# Patient Record
Sex: Female | Born: 1980 | Race: Black or African American | Hispanic: No | Marital: Single | Smoking: Current every day smoker
Health system: Southern US, Community
[De-identification: ages and names within clinical notes are randomized; demographics above are authoritative.]

## PROBLEM LIST (undated history)

## (undated) DIAGNOSIS — D649 Anemia, unspecified: Secondary | ICD-10-CM

## (undated) DIAGNOSIS — K5900 Constipation, unspecified: Secondary | ICD-10-CM

## (undated) DIAGNOSIS — F1290 Cannabis use, unspecified, uncomplicated: Secondary | ICD-10-CM

## (undated) DIAGNOSIS — R0981 Nasal congestion: Secondary | ICD-10-CM

## (undated) DIAGNOSIS — K642 Third degree hemorrhoids: Secondary | ICD-10-CM

## (undated) DIAGNOSIS — Z720 Tobacco use: Secondary | ICD-10-CM

## (undated) DIAGNOSIS — K602 Anal fissure, unspecified: Secondary | ICD-10-CM

## (undated) DIAGNOSIS — J40 Bronchitis, not specified as acute or chronic: Secondary | ICD-10-CM

## (undated) DIAGNOSIS — Z789 Other specified health status: Secondary | ICD-10-CM

## (undated) HISTORY — PX: ANKLE SURGERY: SHX546

---

## 2004-08-22 ENCOUNTER — Emergency Department: Payer: Self-pay | Admitting: General Practice

## 2005-03-04 ENCOUNTER — Emergency Department: Payer: Self-pay | Admitting: Internal Medicine

## 2006-02-06 ENCOUNTER — Emergency Department: Payer: Self-pay | Admitting: Emergency Medicine

## 2006-02-14 ENCOUNTER — Ambulatory Visit: Payer: Self-pay | Admitting: Orthopedic Surgery

## 2006-04-05 ENCOUNTER — Encounter: Payer: Self-pay | Admitting: Orthopedic Surgery

## 2006-04-08 ENCOUNTER — Encounter: Payer: Self-pay | Admitting: Orthopedic Surgery

## 2006-05-09 ENCOUNTER — Encounter: Payer: Self-pay | Admitting: Orthopedic Surgery

## 2006-05-25 ENCOUNTER — Ambulatory Visit: Payer: Self-pay | Admitting: Orthopedic Surgery

## 2006-07-01 ENCOUNTER — Emergency Department: Payer: Self-pay | Admitting: Emergency Medicine

## 2009-02-16 ENCOUNTER — Emergency Department: Payer: Self-pay | Admitting: Emergency Medicine

## 2009-02-18 ENCOUNTER — Inpatient Hospital Stay: Payer: Self-pay | Admitting: Obstetrics and Gynecology

## 2010-07-05 ENCOUNTER — Emergency Department: Payer: Self-pay | Admitting: Emergency Medicine

## 2010-07-06 ENCOUNTER — Emergency Department: Payer: Self-pay | Admitting: Emergency Medicine

## 2016-05-19 ENCOUNTER — Emergency Department: Payer: Self-pay

## 2016-05-19 ENCOUNTER — Encounter: Payer: Self-pay | Admitting: Emergency Medicine

## 2016-05-19 ENCOUNTER — Emergency Department
Admission: EM | Admit: 2016-05-19 | Discharge: 2016-05-19 | Disposition: A | Discharge status: Home / Self Care | Payer: Self-pay | Attending: Student | Admitting: Student

## 2016-05-19 DIAGNOSIS — M25572 Pain in left ankle and joints of left foot: Secondary | ICD-10-CM | POA: Insufficient documentation

## 2016-05-19 DIAGNOSIS — N390 Urinary tract infection, site not specified: Secondary | ICD-10-CM | POA: Insufficient documentation

## 2016-05-19 DIAGNOSIS — R300 Dysuria: Secondary | ICD-10-CM

## 2016-05-19 DIAGNOSIS — F17200 Nicotine dependence, unspecified, uncomplicated: Secondary | ICD-10-CM | POA: Insufficient documentation

## 2016-05-19 DIAGNOSIS — L02412 Cutaneous abscess of left axilla: Secondary | ICD-10-CM | POA: Insufficient documentation

## 2016-05-19 DIAGNOSIS — G8929 Other chronic pain: Secondary | ICD-10-CM | POA: Insufficient documentation

## 2016-05-19 HISTORY — DX: Other specified health status: Z78.9

## 2016-05-19 LAB — URINALYSIS COMPLETE WITH MICROSCOPIC (ARMC ONLY)
Bilirubin Urine: NEGATIVE
Glucose, UA: NEGATIVE mg/dL
HGB URINE DIPSTICK: NEGATIVE
Ketones, ur: NEGATIVE mg/dL
Nitrite: NEGATIVE
PROTEIN: 30 mg/dL — AB
SPECIFIC GRAVITY, URINE: 1.02 (ref 1.005–1.030)
pH: 8 (ref 5.0–8.0)

## 2016-05-19 LAB — COMPREHENSIVE METABOLIC PANEL
ALBUMIN: 3.9 g/dL (ref 3.5–5.0)
ALT: 18 U/L (ref 14–54)
AST: 19 U/L (ref 15–41)
Alkaline Phosphatase: 48 U/L (ref 38–126)
Anion gap: 4 — ABNORMAL LOW (ref 5–15)
BUN: 9 mg/dL (ref 6–20)
CHLORIDE: 105 mmol/L (ref 101–111)
CO2: 26 mmol/L (ref 22–32)
Calcium: 9.2 mg/dL (ref 8.9–10.3)
Creatinine, Ser: 0.83 mg/dL (ref 0.44–1.00)
GFR calc Af Amer: >60 mL/min (ref >60)
Glucose, Bld: 104 mg/dL — ABNORMAL HIGH (ref 65–99)
POTASSIUM: 3.7 mmol/L (ref 3.5–5.1)
SODIUM: 135 mmol/L (ref 135–145)
Total Bilirubin: 0.3 mg/dL (ref 0.3–1.2)
Total Protein: 7.1 g/dL (ref 6.5–8.1)

## 2016-05-19 LAB — GLUCOSE, CAPILLARY: GLUCOSE-CAPILLARY: 119 mg/dL — AB (ref 65–99)

## 2016-05-19 LAB — POCT PREGNANCY, URINE: PREG TEST UR: NEGATIVE

## 2016-05-19 MED ORDER — LIDOCAINE-EPINEPHRINE (PF) 1 %-1:200000 IJ SOLN
INTRAMUSCULAR | Status: AC
  Administered 2016-05-19: 30 mL
  Filled 2016-05-19: qty 30

## 2016-05-19 MED ORDER — CEPHALEXIN 500 MG PO CAPS: 500.0000 mg | ORAL_CAPSULE | Freq: Two times a day (BID) | ORAL | 0 refills | Status: AC

## 2016-05-19 NOTE — ED Notes (Signed)
Pt alert and oriented X4, active, cooperative, pt in NAD. RR even and unlabored, color WNL.  Pt informed to return if any life threatening symptoms occur.   

## 2016-05-19 NOTE — ED Triage Notes (Signed)
Pt reports a cyst to left arm pit that started about 2 days ago. Also has had left ankle and foot pain for 3 months. Denies injury to foot. Reports swelling on the bottom of the foot and burning.

## 2016-05-19 NOTE — ED Provider Notes (Signed)
[facility] Emergency Department Provider Note   ____________________________________________  Time seen: 6:24 pm  I have reviewed the triage vital signs and the nursing notes.   HISTORY  Chief Complaint Foot Pain and Other ("cyst" to left axilla)    HPI Teresa Mclean is a 35 y.o. female with no chronic medical problems who presents for evaluation of 3 days of swelling in the left axilla, gradual onset, constant, moderate to severe, worse when she moves the left arm, worse with palpation. No fevers or chills, no vomiting or diarrhea. She is also complaining of "3 to 6 months" of intermittent left ankle pain in the absence of trauma. She also notes that she intermittently has some dysuria and some intermittent lower abdominal pain and is worried "about my kidneys". She denies chest pain or difficulty breathing.   Past Medical History:  Diagnosis Date  . Patient denies medical problems     There are no active problems to display for this patient.   Past Surgical History:  Procedure Laterality Date  . ANKLE SURGERY      Prior to Admission medications   Not on File    Allergies Review of patient's allergies indicates no known allergies.  History reviewed. No pertinent family history.  Social History Social History  Substance Use Topics  . Smoking status: Current Every Day Smoker  . Smokeless tobacco: Never Used  . Alcohol use Yes    Review of Systems Constitutional: No fever/chills Eyes: No visual changes. ENT: No sore throat. Cardiovascular: Denies chest pain. Respiratory: Denies shortness of breath. Gastrointestinal:+intermittent abdominal pain.  No nausea, no vomiting.  No diarrhea.  No constipation. Genitourinary: Positive for dysuria. Musculoskeletal: Negative for back pain. Skin: Negative for rash. Neurological: Negative for headaches, focal weakness or numbness.  10-point ROS otherwise  negative.  ____________________________________________   PHYSICAL EXAM:  Vitals:   05/19/16 1812 05/19/16 1954  BP:  102/81  Pulse: 100 84  Resp: 18 18  Temp: 98.3 F (36.8 C)   TempSrc: Oral   SpO2: 100% 100%  Weight: 170 lb (77.1 kg)   Height: 5\' 5"  (1.651 m)     VITAL SIGNS: ED Triage Vitals  Enc Vitals Group     BP --      Pulse Rate 05/19/16 1812 100     Resp 05/19/16 1812 18     Temp 05/19/16 1812 98.3 F (36.8 C)     Temp Source 05/19/16 1812 Oral     SpO2 05/19/16 1812 100 %     Weight 05/19/16 1812 170 lb (77.1 kg)     Height 05/19/16 1812 5\' 5"  (1.651 m)     Head Circumference --      Peak Flow --      Pain Score 05/19/16 1813 7     Pain Loc --      Pain Edu? --      Excl. in [place]? --     Constitutional: Alert and oriented. Well appearing and in no acute distress. Eyes: Conjunctivae are normal. PERRL. EOMI. Head: Atraumatic. Nose: No congestion/rhinnorhea. Mouth/Throat: Mucous membranes are moist.  Oropharynx non-erythematous. Neck: No stridor.   Cardiovascular: Normal rate, regular rhythm. Grossly normal heart sounds.  Good peripheral circulation. Respiratory: Normal respiratory effort.  No retractions. Lungs CTAB. Gastrointestinal: Soft and nontender. No distention.  No CVA tenderness. Genitourinary: deferred Musculoskeletal: Mild tenderness inferior to the left lateral knee malleolus of the ankle, no swelling, no erythema, no bony abnormality. 2+ left DP  pulse, wiggles the toes. Neurologic:  Normal speech and language. No gross focal neurologic deficits are appreciated. No gait instability. Skin:  Skin is warm, dry and intact. 2 x 4 cm area of tender induration with fluctuance in the left axilla without associated erythema or warmth. Psychiatric: Mood and affect are normal. Speech and behavior are normal.  ____________________________________________   LABS (all labs ordered are listed, but only abnormal results are displayed)  Labs Reviewed   GLUCOSE, CAPILLARY - Abnormal; Notable for the following:       Result Value   Glucose-Capillary 119 (*)    All other components within normal limits  COMPREHENSIVE METABOLIC PANEL - Abnormal; Notable for the following:    Glucose, Bld 104 (*)    Anion gap 4 (*)    All other components within normal limits  URINALYSIS COMPLETEWITH MICROSCOPIC ([facility] ONLY) - Abnormal; Notable for the following:    Color, Urine YELLOW (*)    APPearance CLEAR (*)    Protein, ur 30 (*)    Leukocytes, UA TRACE (*)    Bacteria, UA RARE (*)    Squamous Epithelial / LPF 0-5 (*)    All other components within normal limits  URINE CULTURE  POC URINE PREG, ED  POCT PREGNANCY, URINE   ____________________________________________  EKG  none ____________________________________________  RADIOLOGY  Xray left ankle FINDINGS: Osseous mineralization normal.  Joint spaces preserved.  No fracture, dislocation, or bone destruction.  IMPRESSION: Normal exam. ____________________________________________   PROCEDURES  Procedure(s) performed:   INCISION AND DRAINAGE Performed by: Loura Pardon A Consent: Verbal consent obtained. Risks and benefits: risks, benefits and alternatives were discussed Type: abscess  Body area: left axilla  Anesthesia: local infiltration  Incision was made with a scalpel.  Local anesthetic: lidocaine 1% with epinephrine  Anesthetic total: 6 ml  Complexity: complex Blunt dissection to break up loculations  Drainage: purulent  Drainage amount: 5 cc  Packing material: 1/4 in iodoform gauze  Patient tolerance: Patient tolerated the procedure well with no immediate complications.    Procedures  Critical Care performed: No  ____________________________________________   INITIAL IMPRESSION / ASSESSMENT AND PLAN / ED COURSE  Pertinent labs & imaging results that were available during my care of the patient were reviewed by me and considered in my medical  decision making (see chart for details).  Teresa Mclean is a 35 y.o. female with no chronic medical problems who presents for evaluation of 3 days of swelling in the left axilla consistent with acute cutaneous abscess without erythema. She has been consented for incision and drainage which I will perform. She also has chronic pain in the left ankle which is nonspecific, we'll obtain plain films to doubt fracture or dislocation. We'll obtain a screening CMP as well as urinalysis and urine pregnancy test given her complaint of dysuria as well as intermittent abdominal cramping though she has a benign abdominal exam and no pain currently and I doubt any acute life-threatening intra-abdominal process versus obstruction, perforation, acute appendicitis or cholecystitis. Her vital signs are stable, she is afebrile and very well-appearing.  ----------------------------------------- 7:55 PM on 05/19/2016 ----------------------------------------- Labs reviewed. Unremarkable CMP, negative pregnancy test, urinalysis with trace leukocytes, 6-30 white blood cells, we'll treat with Keflex for possible UTI and send culture. Plain films of the ankle are negative, I discussed with her that if her chronic ankle pain is ongoing, she will need to follow up with orthopedic surgery. She tolerated incision and drainage of the left axillary abscess very well.  She will return in 48 hours for recheck/packing removal. We discussed meticulous return precautions, need for close follow-up and she is comfortable with the discharge plan. DC home.   Clinical Course     ____________________________________________   FINAL CLINICAL IMPRESSION(S) / ED DIAGNOSES  Final diagnoses:  Cutaneous abscess of left axilla  Ankle pain, chronic, left  Dysuria  UTI (lower urinary tract infection)      NEW MEDICATIONS STARTED DURING THIS VISIT:  New Prescriptions   No medications on file     Note:  This document was prepared  using Dragon voice recognition software and may include unintentional dictation errors.    Joanne Gavel, MD 05/19/16 [phone]

## 2016-05-19 NOTE — ED Triage Notes (Signed)
Pt also c/o intermittent back and lower abdominal pain. Would like her kidneys checked when here as well.

## 2016-05-21 ENCOUNTER — Encounter: Payer: Self-pay | Admitting: *Deleted

## 2016-05-21 ENCOUNTER — Emergency Department
Admission: EM | Admit: 2016-05-21 | Discharge: 2016-05-21 | Disposition: A | Discharge status: Home / Self Care | Payer: Self-pay | Attending: Student | Admitting: Student

## 2016-05-21 DIAGNOSIS — F1290 Cannabis use, unspecified, uncomplicated: Secondary | ICD-10-CM | POA: Insufficient documentation

## 2016-05-21 DIAGNOSIS — Z4801 Encounter for change or removal of surgical wound dressing: Secondary | ICD-10-CM | POA: Insufficient documentation

## 2016-05-21 DIAGNOSIS — Z09 Encounter for follow-up examination after completed treatment for conditions other than malignant neoplasm: Secondary | ICD-10-CM

## 2016-05-21 NOTE — ED Triage Notes (Signed)
Pt is here today for a follow up wound recheck from abscess under left arm

## 2016-05-21 NOTE — Discharge Instructions (Signed)
Finish all antibiotics as prescribed to completion.  Keep wound clean and dry until fully closed then may cleanse per usual routine.

## 2016-05-21 NOTE — ED Provider Notes (Signed)
[facility] Emergency Department Provider Note  ____________________________________________  Time seen: Approximately 6:26 PM  I have reviewed the triage vital signs and the nursing notes.   HISTORY  Chief Complaint Wound Check    HPI Teresa Mclean is a 35 y.o. female , NAD, presents to the emergency department for recheck of abscess following incision and drainage. Patient states she has kept the dressing on since being seen in this emergency department2 days ago for incision and drainage of left axillary abscess. States she has been tolerating her antibiotics well without any side effects or adverse events. Has not noted any bruising or weeping about the incised area. Denies any fevers, chills, body aches, chest pain, shortness of breath, abdominal pain, nausea, vomiting. Has not experienced any pain in the left upper extremity and is without any numbness, weakness, tingling.   Past Medical History:  Diagnosis Date  . Patient denies medical problems     There are no active problems to display for this patient.   Past Surgical History:  Procedure Laterality Date  . ANKLE SURGERY      Prior to Admission medications   Medication Sig Start Date End Date Taking? Authorizing Provider  cephALEXin (KEFLEX) 500 MG capsule Take 1 capsule (500 mg total) by mouth 2 (two) times daily. 05/19/16 05/26/16  Joanne Gavel, MD    Allergies Review of patient's allergies indicates no known allergies.  No family history on file.  Social History Social History  Substance Use Topics  . Smoking status: Current Every Day Smoker  . Smokeless tobacco: Never Used  . Alcohol use Yes     Review of Systems  Constitutional: No fever/chills, fatigue Cardiovascular: No chest pain. Respiratory: No shortness of breath.  Gastrointestinal: No abdominal pain.  No nausea, vomiting.   Musculoskeletal: Negative for general myalgias.  Skin: Positive abscess status post  incision and drainage left axilla. Negative for rash, redness, swelling, skin sores. Neurological: Negative for him, weakness, tingling. 10-point ROS otherwise negative.  ____________________________________________   PHYSICAL EXAM:  VITAL SIGNS: ED Triage Vitals [05/21/16 1813]  Enc Vitals Group     BP 128/88     Pulse Rate 84     Resp 18     Temp 98.4 F (36.9 C)     Temp Source Oral     SpO2 100 %     Weight 170 lb (77.1 kg)     Height 5\' 5"  (1.651 m)     Head Circumference      Peak Flow      Pain Score      Pain Loc      Pain Edu?      Excl. in [place]?      Constitutional: Alert and oriented. Well appearing and in no acute distress. Eyes: Conjunctivae are normal without icterus or injection. Head: Atraumatic. Neck: Supple with full range of motion Hematological/Lymphatic/Immunilogical: No cervical lymphadenopathy. Cardiovascular:  Good peripheral circulation. Respiratory: Normal respiratory effort without tachypnea or retractions.  Musculoskeletal: Full range of motion of the left upper extremity without pain or difficulty. Neurologic:  Normal speech and language. No gross focal neurologic deficits are appreciated.  Skin:  1cm incision noted about the left axilla with packing in place. No active oozing or weeping. No induration or fluctuance is noted. No rash or skin sores. Packing was completely removed revealing good granulation tissue within the wound without active oozing or weeping. Skin is warm, dry. Psychiatric: Mood and affect are normal. Speech and  behavior are normal. Patient exhibits appropriate insight and judgement.   ____________________________________________   LABS  None ____________________________________________  EKG  None ____________________________________________  RADIOLOGY  None ____________________________________________    PROCEDURES  Procedure(s) performed: None   Procedures   Medications - No data to  display   ____________________________________________   INITIAL IMPRESSION / ASSESSMENT AND PLAN / ED COURSE  Pertinent labs & imaging results that were available during my care of the patient were reviewed by me and considered in my medical decision making (see chart for details).  Clinical Course    Patient's diagnosis is consistent with Encounter for recheck of abscess following incision and drainage. Healthy, healing tissue was noted within the incisional wound after packing was removed without surrounding induration or cellulitis. Large Band-Aid was applied over the wound. Patient will be discharged home with instructions to complete all antibiotics as previously prescribed to completion. Patient is to follow up with [place] if symptoms persist past this treatment course. Patient is given ED precautions to return to the ED for any worsening or new symptoms.    ____________________________________________  FINAL CLINICAL IMPRESSION(S) / ED DIAGNOSES  Final diagnoses:  Encounter for recheck of abscess following incision and drainage      NEW MEDICATIONS STARTED DURING THIS VISIT:  Discharge Medication List as of 05/21/2016  6:31 PM           Braxton Feathers, PA-C 05/21/16 1836    Joanne Gavel, MD 05/21/16 2357

## 2016-05-21 NOTE — ED Notes (Signed)

## 2016-05-22 LAB — URINE CULTURE: Culture: >=100000 — AB

## 2016-06-27 DIAGNOSIS — Z7251 High risk heterosexual behavior: Secondary | ICD-10-CM | POA: Insufficient documentation

## 2016-06-27 DIAGNOSIS — F17213 Nicotine dependence, cigarettes, with withdrawal: Secondary | ICD-10-CM | POA: Insufficient documentation

## 2016-06-27 DIAGNOSIS — R7303 Prediabetes: Secondary | ICD-10-CM | POA: Insufficient documentation

## 2016-06-27 DIAGNOSIS — F17200 Nicotine dependence, unspecified, uncomplicated: Secondary | ICD-10-CM | POA: Insufficient documentation

## 2016-06-27 DIAGNOSIS — M79672 Pain in left foot: Secondary | ICD-10-CM | POA: Insufficient documentation

## 2016-08-28 DIAGNOSIS — R8290 Unspecified abnormal findings in urine: Secondary | ICD-10-CM | POA: Insufficient documentation

## 2016-09-03 DIAGNOSIS — R87623 High grade squamous intraepithelial lesion on cytologic smear of vagina (HGSIL): Secondary | ICD-10-CM | POA: Insufficient documentation

## 2016-10-04 DIAGNOSIS — N879 Dysplasia of cervix uteri, unspecified: Secondary | ICD-10-CM | POA: Insufficient documentation

## 2019-10-15 ENCOUNTER — Ambulatory Visit: Payer: Self-pay | Admitting: Family Medicine

## 2019-10-23 DIAGNOSIS — R1032 Left lower quadrant pain: Secondary | ICD-10-CM | POA: Insufficient documentation

## 2020-03-08 ENCOUNTER — Emergency Department: Payer: Commercial Managed Care - PPO

## 2020-03-08 ENCOUNTER — Emergency Department
Admission: EM | Admit: 2020-03-08 | Discharge: 2020-03-08 | Disposition: A | Discharge status: Home / Self Care | Payer: Commercial Managed Care - PPO | Attending: Emergency Medicine | Admitting: Emergency Medicine

## 2020-03-08 ENCOUNTER — Encounter: Payer: Self-pay | Admitting: Emergency Medicine

## 2020-03-08 ENCOUNTER — Other Ambulatory Visit: Payer: Self-pay

## 2020-03-08 DIAGNOSIS — N83201 Unspecified ovarian cyst, right side: Secondary | ICD-10-CM | POA: Diagnosis not present

## 2020-03-08 DIAGNOSIS — R1030 Lower abdominal pain, unspecified: Secondary | ICD-10-CM | POA: Diagnosis present

## 2020-03-08 DIAGNOSIS — Z8541 Personal history of malignant neoplasm of cervix uteri: Secondary | ICD-10-CM | POA: Insufficient documentation

## 2020-03-08 DIAGNOSIS — F17210 Nicotine dependence, cigarettes, uncomplicated: Secondary | ICD-10-CM | POA: Insufficient documentation

## 2020-03-08 LAB — POCT PREGNANCY, URINE: Preg Test, Ur: NEGATIVE

## 2020-03-08 LAB — URINALYSIS, COMPLETE (UACMP) WITH MICROSCOPIC
Bacteria, UA: NONE SEEN
Bilirubin Urine: NEGATIVE
Glucose, UA: NEGATIVE mg/dL
Hgb urine dipstick: NEGATIVE
Ketones, ur: 5 mg/dL — AB
Nitrite: NEGATIVE
Protein, ur: NEGATIVE mg/dL
Specific Gravity, Urine: 1.024 (ref 1.005–1.030)
pH: 5 (ref 5.0–8.0)

## 2020-03-08 LAB — COMPREHENSIVE METABOLIC PANEL
ALT: 19 U/L (ref 0–44)
AST: 18 U/L (ref 15–41)
Albumin: 4.2 g/dL (ref 3.5–5.0)
Alkaline Phosphatase: 51 U/L (ref 38–126)
Anion gap: 8 (ref 5–15)
BUN: 9 mg/dL (ref 6–20)
CO2: 25 mmol/L (ref 22–32)
Calcium: 9.3 mg/dL (ref 8.9–10.3)
Chloride: 103 mmol/L (ref 98–111)
Creatinine, Ser: 0.92 mg/dL (ref 0.44–1.00)
GFR calc Af Amer: >60 mL/min (ref >60)
GFR calc non Af Amer: >60 mL/min (ref >60)
Glucose, Bld: 82 mg/dL (ref 70–99)
Potassium: 4 mmol/L (ref 3.5–5.1)
Sodium: 136 mmol/L (ref 135–145)
Total Bilirubin: 0.6 mg/dL (ref 0.3–1.2)
Total Protein: 7.8 g/dL (ref 6.5–8.1)

## 2020-03-08 LAB — CBC
HCT: 38.9 % (ref 36.0–46.0)
Hemoglobin: 13.4 g/dL (ref 12.0–15.0)
MCH: 32.3 pg (ref 26.0–34.0)
MCHC: 34.4 g/dL (ref 30.0–36.0)
MCV: 93.7 fL (ref 80.0–100.0)
Platelets: 181 10*3/uL (ref 150–400)
RBC: 4.15 MIL/uL (ref 3.87–5.11)
RDW: 12.8 % (ref 11.5–15.5)
WBC: 7.4 10*3/uL (ref 4.0–10.5)
nRBC: 0 % (ref 0.0–0.2)

## 2020-03-08 LAB — LIPASE, BLOOD: Lipase: 22 U/L (ref 11–51)

## 2020-03-08 MED ORDER — TRAMADOL HCL 50 MG PO TABS: 50.0000 mg | ORAL_TABLET | Freq: Four times a day (QID) | ORAL | 0 refills | Status: DC | PRN

## 2020-03-08 MED ORDER — IOHEXOL 300 MG/ML  SOLN
100.0000 mL | Freq: Once | INTRAMUSCULAR | Status: AC | PRN
  Administered 2020-03-08: 100 mL via INTRAVENOUS
  Filled 2020-03-08: qty 100

## 2020-03-08 NOTE — Discharge Instructions (Signed)
Follow-up with your regular doctor if not improving in 2 to 3 days.  Return emergency department worsening.  Follow-up with GYN for a ultrasound in 6 to 12 weeks to assess the ovarian cyst.

## 2020-03-08 NOTE — ED Triage Notes (Signed)
Pt here for lower back pain and lower abdominal pain for 6 months. Pain has been intermittent but is worse today. No fever, NVD.  No urinary sx.

## 2020-03-08 NOTE — ED Notes (Signed)
See triage note. Presents with mid back pain and lower abd pain  States sx's started 6 months ago  Pain became worse yesterday

## 2020-03-08 NOTE — ED Provider Notes (Signed)
[facility] Emergency Department Provider Note  ____________________________________________   First MD Initiated Contact with Patient 03/08/20 1439     (approximate)  I have reviewed the triage vital signs and the nursing notes.   HISTORY  Chief Complaint Abdominal Pain    HPI Teresa Mclean is a 39 y.o. female presents emergency department complaint of mid back pain and lower abdominal pain.  History of the same on and off for 6 months.   However pain has increased into a sharp type pain in the right lower quadrant.  Patient still has an appendix.  She did have a treatment for cervical cancer several years ago.  No history of ovarian cyst.  No fever or chills.  No vaginal discharge.  Pain score is 7/10   Past Medical History:  Diagnosis Date  . Patient denies medical problems     There are no problems to display for this patient.   Past Surgical History:  Procedure Laterality Date  . ANKLE SURGERY      Prior to Admission medications   Medication Sig Start Date End Date Taking? Authorizing Provider  traMADol (ULTRAM) 50 MG tablet Take 1 tablet (50 mg total) by mouth every 6 (six) hours as needed. 03/08/20   Versie Starks, PA-C    Allergies Patient has no known allergies.  History reviewed. No pertinent family history.  Social History Social History   Tobacco Use  . Smoking status: Current Every Day Smoker  . Smokeless tobacco: Never Used  Substance Use Topics  . Alcohol use: Yes  . Drug use: Yes    Types: Marijuana    Review of Systems  Constitutional: No fever/chills Eyes: No visual changes. ENT: No sore throat. Respiratory: Denies cough Cardiovascular: Denies chest pain Gastrointestinal: Positive abdominal pain Genitourinary: Negative for dysuria. Musculoskeletal: Negative for back pain. Skin: Negative for rash. Psychiatric: no mood changes,     ____________________________________________   PHYSICAL  EXAM:  VITAL SIGNS: ED Triage Vitals [03/08/20 1326]  Enc Vitals Group     BP 122/88     Pulse Rate 88     Resp 16     Temp 98.8 F (37.1 C)     Temp Source Oral     SpO2 99 %     Weight 187 lb (84.8 kg)     Height 5\' 5"  (1.651 m)     Head Circumference      Peak Flow      Pain Score 7     Pain Loc      Pain Edu?      Excl. in [place]?     Constitutional: Alert and oriented. Well appearing and in no acute distress. Eyes: Conjunctivae are normal.  Head: Atraumatic. Nose: No congestion/rhinnorhea. Mouth/Throat: Mucous membranes are moist.   Neck:  supple no lymphadenopathy noted Cardiovascular: Normal rate, regular rhythm. Heart sounds are normal Respiratory: Normal respiratory effort.  No retractions, lungs c t a  Abd: soft tender in the right lower quadrant, bs normal all 4 quad GU: deferred Musculoskeletal: FROM all extremities, warm and well perfused Neurologic:  Normal speech and language.  Skin:  Skin is warm, dry and intact. No rash noted. Psychiatric: Mood and affect are normal. Speech and behavior are normal.  ____________________________________________   LABS (all labs ordered are listed, but only abnormal results are displayed)  Labs Reviewed  URINALYSIS, COMPLETE (UACMP) WITH MICROSCOPIC - Abnormal; Notable for the following components:      Result Value  Color, Urine YELLOW (*)    APPearance HAZY (*)    Ketones, ur 5 (*)    Leukocytes,Ua TRACE (*)    All other components within normal limits  LIPASE, BLOOD  COMPREHENSIVE METABOLIC PANEL  CBC  POC URINE PREG, ED  POCT PREGNANCY, URINE   ____________________________________________   ____________________________________________  RADIOLOGY  CT abdomen/pelvis with IV contrast to rule out appendicitis  ____________________________________________   PROCEDURES  Procedure(s) performed: No  Procedures    ____________________________________________   INITIAL IMPRESSION / ASSESSMENT AND  PLAN / ED COURSE  Pertinent labs & imaging results that were available during my care of the patient were reviewed by me and considered in my medical decision making (see chart for details).   Patient is a 39 year old female presents emergency department with abdominal pain which is worse in the last 2 days and is giving a sharp pain to the right lower quadrant.  See HPI.  Physical exam does show the right lower quadrant be tender palpation.  Remainder exam is basically unremarkable's time.  DDx: Ovarian cyst, acute appendicitis, mesenteric lymphadenitis, kidney stone  CBC is normal, metabolic panel is normal, POC pregnancy is negative, urinalysis shows trace of leuks with no bacteria.  Feel that the labs are reassuring patient is not septic and does not need immediate antibiotic/intervention.  I do feel that we should do a CT abdomen/pelvis to rule out appendicitis.  Patient agrees to treatment plan.  Patient CT does not show appendicitis.  Does show a 4.4 cm ovarian cyst. I did discuss this with patient.  She is to follow-up with GYN for evaluation and will need a ultrasound performed in 6 to 12 weeks.  She is given pain medication for as needed.  A work note.  Discharged in stable condition.   Teresa Mclean was evaluated in Emergency Department on 03/08/2020 for the symptoms described in the history of present illness. She was evaluated in the context of the global COVID-19 pandemic, which necessitated consideration that the patient might be at risk for infection with the SARS-CoV-2 virus that causes COVID-19. Institutional protocols and algorithms that pertain to the evaluation of patients at risk for COVID-19 are in a state of rapid change based on information released by regulatory bodies including the CDC and federal and state organizations. These policies and algorithms were followed during the patient's care in the ED.   As part of my medical decision making, I reviewed the following  data within the [place] notes reviewed and incorporated, Labs reviewed , Old chart reviewed, Radiograph reviewed , Notes from prior ED visits and [place] Controlled Substance Database  ____________________________________________   FINAL CLINICAL IMPRESSION(S) / ED DIAGNOSES  Final diagnoses:  Cyst of right ovary      NEW MEDICATIONS STARTED DURING THIS VISIT:  New Prescriptions   TRAMADOL (ULTRAM) 50 MG TABLET    Take 1 tablet (50 mg total) by mouth every 6 (six) hours as needed.     Note:  This document was prepared using Dragon voice recognition software and may include unintentional dictation errors.    Versie Starks, PA-C 03/08/20 1542    Harvest Dark, MD 03/09/20 2149

## 2020-04-23 ENCOUNTER — Other Ambulatory Visit: Payer: Self-pay | Admitting: Obstetrics and Gynecology

## 2020-04-30 ENCOUNTER — Other Ambulatory Visit: Payer: Self-pay | Admitting: Obstetrics and Gynecology

## 2020-06-09 NOTE — H&P (Signed)
[name] is a 39 y.o. female here for Referred by Anderson [facility] Oxley,CNM-ovarian cysts see on CT . Pt is here for right sided chronic pelvic pain . She went to the ED 6 weeks ago with back and pelvic pain and underwent a CTSCAN that showed ovarian cyst . U/s done today :   Result status: In process  Ut wnl  Endometrium=6.58 mm  bil ov cysts seen  1 rt complex= 2.83 cm Septation=0.14 cm  1 lt septated= 3.17 cm Septation=0.21 cm 2 paraovarian simple Lt cyst= 1.40cm   Pt states that she has had pelvic pain Right sided>> left  intermittent for > 5 yrs . May come on for 2-3 months at a time bilateral . 8+ / 10 and movt may ease . 8+/10  She has dyspareunia  And has not been able to conceive in 8 yrs ( unprotected sex with the FOB daughter 52 yo )    Recent GC / chlamydia neg .  + HR HPV and colposcopic exam  cxbx pending  Was treated with doxycycline 14 day  03/11/2020.   G1P1 s/p SVD  Past Medical History:  has no past medical history on file.  Past Surgical History:  has a past surgical history that includes Colposcopy. Family History: family history includes Diabetes in her maternal aunt, maternal uncle, and mother; High blood pressure (Hypertension) in her mother. Social History:  reports that she has been smoking cigarettes. She has been smoking about 1.00 pack per day. She has never used smokeless tobacco. She reports current alcohol use. She reports that she does not use drugs. OB/GYN History:          OB History    Gravida  1   Para  1   Term  1   Preterm      AB      Living  1     SAB      TAB      Ectopic      Molar      Multiple      Live Births  1          Allergies: has No Known Allergies. Medications:  Current Outpatient Medications:  .  docusate (COLACE) 100 MG capsule, TAKE 1 CAPSULE BY MOUTH TWICE DAILY AS NEEDED FOR CONSTIPATION, Disp: , Rfl:  .  traMADoL (ULTRAM) 50 mg tablet, , Disp: , Rfl:   Review of Systems: General:                       No fatigue or weight loss Eyes:                           No vision changes Ears:                            No hearing difficulty Respiratory:                No cough or shortness of breath Pulmonary:                  No asthma or shortness of breath Cardiovascular:           No chest pain, palpitations, dyspnea on exertion Gastrointestinal:          No abdominal bloating, chronic diarrhea, constipations, masses, pain or hematochezia Genitourinary:  No hematuria, dysuria, abnormal vaginal discharge, pelvic pain, Menometrorrhagia Lymphatic:                   No swollen lymph nodes Musculoskeletal:         No muscle weakness Neurologic:                  No extremity weakness, syncope, seizure disorder Psychiatric:                  No history of depression, delusions or suicidal/homicidal ideation    Exam:      Vitals:   04/22/20 1053  BP: 130/88  Pulse: 75    Body mass index is 33.13 kg/m.  WDWN / black female in NAD   Lungs: CTA  CV : RRR without murmur   Neck:  no thyromegaly Abdomen: soft , no mass, normal active bowel sounds,  non-tender, no rebound tenderness Pelvic: tanner stage 5 ,  External genitalia: vulva /labia no lesions Urethra: no prolapse Vagina: normal physiologic d/c Cervix:++ CMT , no mucopus  no lesions, Uterus: normal size shape and contour, non-tender Adnexa: no mass,  non-tender   Rectovaginal:  Impression:   The primary encounter diagnosis was Chronic pelvic pain in female. Diagnoses of Secondary female infertility, unspecified, Ovarian cyst, bilateral, and Dyspareunia, female were also pertinent to this visit. Pain may be pelvic adhesions , endometriosis .  Secondary infertility without workup  Cervical motion tenderness significant     Plan:   I had a long discussion with the patient regarding treatment options : expectant management , ocps( cant take with her tobacco use) , Depo Provera , IUD , vs  surgical intervention . Pt is considering future childbearing . I had recommended an operative L/S with possible LOA , excision of endometriosis , possible right ovarian cystectomy / oophorectomy .   definitive surgery discussed and ultimately she has opted for LAVH , bilateral salpingectomy , possible right oophorectomy    The proposed benefit of the surgery has been discussed with the patient. The possible risks include, but are not limited to: organ injury to the bowel , bladder, ureters, and major blood vessels and nerves. There is a possibility of additional surgeries resulting from these injuries. There is also the risk of blood transfusion and the need to receive blood products during or after the procedure which may rarely lead to HIV or Hepatitis C infection. There is a risk of developing a deep venous thrombosis or a pulmonary embolism . There is the possibility of wound infection and also anesthetic complications, even the rare possibility of death. The patient understands these risks and wishes to proceed. All questions have been answered and the consent has been signed.          Caroline Sauger, MD

## 2020-06-21 ENCOUNTER — Encounter
Admission: RE | Admit: 2020-06-21 | Discharge: 2020-06-21 | Disposition: A | Discharge status: Home / Self Care | Payer: Commercial Managed Care - PPO | Source: Ambulatory Visit | Attending: Obstetrics and Gynecology | Admitting: Obstetrics and Gynecology

## 2020-06-21 ENCOUNTER — Other Ambulatory Visit: Payer: Self-pay

## 2020-06-21 HISTORY — DX: Nasal congestion: R09.81

## 2020-06-21 NOTE — Patient Instructions (Signed)
Your procedure is scheduled on: Friday June 25, 2020. Report to Day Surgery inside [facility] 2nd floor. To find out your arrival time please call [phone] between 1PM - 3PM on Thursday June 24, 2020.  Remember: Instructions that are not followed completely may result in serious medical risk,  up to and including death, or upon the discretion of your surgeon and anesthesiologist your  surgery may need to be rescheduled.     _X__ 1. Do not eat food after midnight the night before your procedure.                 No chewing gum or hard candies. You may drink clear liquids up to 2 hours                 before you are scheduled to arrive for your surgery- DO not drink clear                 liquids within 2 hours of the start of your surgery.                 Clear Liquids include:  water, apple juice without pulp, clear Gatorade, G2 or                  Gatorade Zero (avoid Red/Purple/Blue), Black Coffee or Tea (Do not add                 anything to coffee or tea).  ___X_2. Complete the "Ensure Clear Pre-surgery Clear Carbohydrate Drink" provided to you, 2 hours before arrival.   __X__3.  On the morning of surgery brush your teeth with toothpaste and water, you                may rinse your mouth with mouthwash if you wish.  Do not swallow any toothpaste of mouthwash.     _X__ 4.  No Alcohol for 24 hours before or after surgery.   _X__ 5.  Do Not Smoke or use e-cigarettes For 24 Hours Prior to Your Surgery.                 Do not use any chewable tobacco products for at least 6 hours prior to                 Surgery.  _X__  6.  Do not use any recreational drugs (marijuana, cocaine, heroin, ecstasy, MDMA or other)                For at least one week prior to your surgery.  Combination of these drugs with anesthesia                May have life threatening results.  __X__  7.  Notify your doctor if there is any change in your medical condition       (cold, fever, infections).     Do not wear jewelry, make-up, hairpins, clips or nail polish. Do not wear lotions, powders, or perfumes. You may wear deodorant. Do not shave 48 hours prior to surgery. Men may shave face and neck. Do not bring valuables to the hospital.    [facility] is not responsible for any belongings or valuables.  Contacts, dentures or bridgework may not be worn into surgery. Leave your suitcase in the car. After surgery it may be brought to your room. For patients admitted to the hospital, discharge time is determined by your treatment team.   Patients discharged  the day of surgery will not be allowed to drive home.   Make arrangements for someone to be with you for the first 24 hours of your Same Day Discharge.   ____ Take these medicines the morning of surgery with A SIP OF WATER:    1. None   __X__ Use CHG Soap as directed  __X__ Stop Anti-inflammatories such as Ibuprofen, Aleve, Advil naproxen and or BC powders.   __X__ Stop supplements until after surgery.    __X__ Do not start any herbal supplements before your surgery.    If you have any questions regarding your pre-procedure instructions,  Please call Pre-admit Testing at [phone].

## 2020-06-23 ENCOUNTER — Other Ambulatory Visit
Admission: RE | Admit: 2020-06-23 | Discharge: 2020-06-23 | Disposition: A | Discharge status: Home / Self Care | Payer: Commercial Managed Care - PPO | Source: Ambulatory Visit | Attending: Obstetrics and Gynecology | Admitting: Obstetrics and Gynecology

## 2020-06-23 ENCOUNTER — Other Ambulatory Visit: Payer: Self-pay

## 2020-06-23 DIAGNOSIS — Z01812 Encounter for preprocedural laboratory examination: Secondary | ICD-10-CM | POA: Diagnosis present

## 2020-06-23 DIAGNOSIS — Z20822 Contact with and (suspected) exposure to covid-19: Secondary | ICD-10-CM | POA: Diagnosis not present

## 2020-06-23 LAB — TYPE AND SCREEN
ABO/RH(D): B POS
Antibody Screen: NEGATIVE

## 2020-06-23 LAB — BASIC METABOLIC PANEL
Anion gap: 9 (ref 5–15)
BUN: 10 mg/dL (ref 6–20)
CO2: 23 mmol/L (ref 22–32)
Calcium: 9.6 mg/dL (ref 8.9–10.3)
Chloride: 105 mmol/L (ref 98–111)
Creatinine, Ser: 0.78 mg/dL (ref 0.44–1.00)
GFR calc Af Amer: >60 mL/min (ref >60)
GFR calc non Af Amer: >60 mL/min (ref >60)
Glucose, Bld: 118 mg/dL — ABNORMAL HIGH (ref 70–99)
Potassium: 3.8 mmol/L (ref 3.5–5.1)
Sodium: 137 mmol/L (ref 135–145)

## 2020-06-23 LAB — CBC
HCT: 39.5 % (ref 36.0–46.0)
Hemoglobin: 13.4 g/dL (ref 12.0–15.0)
MCH: 32.2 pg (ref 26.0–34.0)
MCHC: 33.9 g/dL (ref 30.0–36.0)
MCV: 95 fL (ref 80.0–100.0)
Platelets: 197 10*3/uL (ref 150–400)
RBC: 4.16 MIL/uL (ref 3.87–5.11)
RDW: 13 % (ref 11.5–15.5)
WBC: 9.1 10*3/uL (ref 4.0–10.5)
nRBC: 0 % (ref 0.0–0.2)

## 2020-06-24 LAB — SARS CORONAVIRUS 2 (TAT 6-24 HRS): SARS Coronavirus 2: NEGATIVE

## 2020-06-24 NOTE — Progress Notes (Signed)
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

## 2020-06-25 ENCOUNTER — Ambulatory Visit: Payer: Commercial Managed Care - PPO | Admitting: Certified Registered Nurse Anesthetist

## 2020-06-25 ENCOUNTER — Ambulatory Visit
Admission: RE | Admit: 2020-06-25 | Discharge: 2020-06-25 | Disposition: A | Discharge status: Home / Self Care | Payer: Commercial Managed Care - PPO | Attending: Obstetrics and Gynecology | Admitting: Obstetrics and Gynecology

## 2020-06-25 ENCOUNTER — Other Ambulatory Visit: Payer: Self-pay

## 2020-06-25 ENCOUNTER — Encounter: Payer: Self-pay | Admitting: Obstetrics and Gynecology

## 2020-06-25 ENCOUNTER — Encounter
Admission: RE | Disposition: A | Discharge status: Home / Self Care | Payer: Self-pay | Source: Home / Self Care | Attending: Obstetrics and Gynecology

## 2020-06-25 DIAGNOSIS — D060 Carcinoma in situ of endocervix: Secondary | ICD-10-CM | POA: Diagnosis not present

## 2020-06-25 DIAGNOSIS — R102 Pelvic and perineal pain: Secondary | ICD-10-CM | POA: Diagnosis present

## 2020-06-25 DIAGNOSIS — D251 Intramural leiomyoma of uterus: Secondary | ICD-10-CM | POA: Diagnosis not present

## 2020-06-25 DIAGNOSIS — Z9889 Other specified postprocedural states: Secondary | ICD-10-CM | POA: Diagnosis not present

## 2020-06-25 DIAGNOSIS — N838 Other noninflammatory disorders of ovary, fallopian tube and broad ligament: Secondary | ICD-10-CM | POA: Insufficient documentation

## 2020-06-25 DIAGNOSIS — N736 Female pelvic peritoneal adhesions (postinfective): Secondary | ICD-10-CM | POA: Insufficient documentation

## 2020-06-25 DIAGNOSIS — F17210 Nicotine dependence, cigarettes, uncomplicated: Secondary | ICD-10-CM | POA: Insufficient documentation

## 2020-06-25 DIAGNOSIS — N941 Unspecified dyspareunia: Secondary | ICD-10-CM | POA: Insufficient documentation

## 2020-06-25 DIAGNOSIS — N800 Endometriosis of uterus: Secondary | ICD-10-CM | POA: Insufficient documentation

## 2020-06-25 DIAGNOSIS — N979 Female infertility, unspecified: Secondary | ICD-10-CM | POA: Diagnosis not present

## 2020-06-25 DIAGNOSIS — N7011 Chronic salpingitis: Secondary | ICD-10-CM | POA: Diagnosis not present

## 2020-06-25 DIAGNOSIS — G8929 Other chronic pain: Secondary | ICD-10-CM | POA: Insufficient documentation

## 2020-06-25 HISTORY — PX: LAPAROSCOPIC VAGINAL HYSTERECTOMY WITH SALPINGECTOMY: SHX6680

## 2020-06-25 LAB — URINE DRUG SCREEN, QUALITATIVE (ARMC ONLY)
Amphetamines, Ur Screen: NOT DETECTED
Barbiturates, Ur Screen: NOT DETECTED
Benzodiazepine, Ur Scrn: NOT DETECTED
Cannabinoid 50 Ng, Ur ~~LOC~~: POSITIVE — AB
Cocaine Metabolite,Ur ~~LOC~~: NOT DETECTED
MDMA (Ecstasy)Ur Screen: NOT DETECTED
Methadone Scn, Ur: NOT DETECTED
Opiate, Ur Screen: NOT DETECTED
Phencyclidine (PCP) Ur S: NOT DETECTED
Tricyclic, Ur Screen: NOT DETECTED

## 2020-06-25 LAB — POCT PREGNANCY, URINE: Preg Test, Ur: NEGATIVE

## 2020-06-25 LAB — ABO/RH: ABO/RH(D): B POS

## 2020-06-25 SURGERY — HYSTERECTOMY, VAGINAL, LAPAROSCOPY-ASSISTED, WITH SALPINGECTOMY
Anesthesia: General | Laterality: Right

## 2020-06-25 MED ORDER — POVIDONE-IODINE 10 % EX SWAB
2.0000 "application " | Freq: Once | CUTANEOUS | Status: AC
  Administered 2020-06-25: 2 via TOPICAL

## 2020-06-25 MED ORDER — DEXAMETHASONE SODIUM PHOSPHATE 10 MG/ML IJ SOLN
INTRAMUSCULAR | Status: AC
  Filled 2020-06-25: qty 1

## 2020-06-25 MED ORDER — PHENYLEPHRINE HCL (PRESSORS) 10 MG/ML IV SOLN
INTRAVENOUS | Status: DC | PRN
  Administered 2020-06-25 (×2): 200 ug via INTRAVENOUS

## 2020-06-25 MED ORDER — MIDAZOLAM HCL 2 MG/2ML IJ SOLN
INTRAMUSCULAR | Status: DC | PRN
  Administered 2020-06-25: 2 mg via INTRAVENOUS

## 2020-06-25 MED ORDER — LACTATED RINGERS IV SOLN: INTRAVENOUS | Status: DC

## 2020-06-25 MED ORDER — LIDOCAINE HCL (PF) 2 % IJ SOLN
INTRAMUSCULAR | Status: AC
  Filled 2020-06-25: qty 5

## 2020-06-25 MED ORDER — FAMOTIDINE 20 MG PO TABS
ORAL_TABLET | ORAL | Status: AC
  Administered 2020-06-25: 20 mg via ORAL
  Filled 2020-06-25: qty 1

## 2020-06-25 MED ORDER — FENTANYL CITRATE (PF) 100 MCG/2ML IJ SOLN
INTRAMUSCULAR | Status: DC | PRN
Start: 2020-06-25 — End: 2020-06-25
  Administered 2020-06-25: 50 ug via INTRAVENOUS
  Administered 2020-06-25: 100 ug via INTRAVENOUS
  Administered 2020-06-25 (×2): 50 ug via INTRAVENOUS

## 2020-06-25 MED ORDER — FENTANYL CITRATE (PF) 250 MCG/5ML IJ SOLN
INTRAMUSCULAR | Status: AC
  Filled 2020-06-25: qty 5

## 2020-06-25 MED ORDER — PROPOFOL 10 MG/ML IV BOLUS
INTRAVENOUS | Status: DC | PRN
  Administered 2020-06-25: 20 mg via INTRAVENOUS
  Administered 2020-06-25: 180 mg via INTRAVENOUS

## 2020-06-25 MED ORDER — MIDAZOLAM HCL 2 MG/2ML IJ SOLN
INTRAMUSCULAR | Status: AC
  Filled 2020-06-25: qty 2

## 2020-06-25 MED ORDER — GABAPENTIN 300 MG PO CAPS: 300.0000 mg | ORAL_CAPSULE | ORAL | Status: AC

## 2020-06-25 MED ORDER — BUPIVACAINE HCL (PF) 0.5 % IJ SOLN
INTRAMUSCULAR | Status: AC
  Filled 2020-06-25: qty 30

## 2020-06-25 MED ORDER — FENTANYL CITRATE (PF) 100 MCG/2ML IJ SOLN
INTRAMUSCULAR | Status: AC
  Administered 2020-06-25: 25 ug via INTRAVENOUS
  Filled 2020-06-25: qty 2

## 2020-06-25 MED ORDER — OXYCODONE HCL 5 MG/5ML PO SOLN: 5.0000 mg | Freq: Once | ORAL | Status: AC | PRN

## 2020-06-25 MED ORDER — PROPOFOL 10 MG/ML IV BOLUS
INTRAVENOUS | Status: AC
  Filled 2020-06-25: qty 40

## 2020-06-25 MED ORDER — DEXAMETHASONE SODIUM PHOSPHATE 10 MG/ML IJ SOLN
INTRAMUSCULAR | Status: DC | PRN
  Administered 2020-06-25: 10 mg via INTRAVENOUS

## 2020-06-25 MED ORDER — ONDANSETRON HCL 4 MG/2ML IJ SOLN
INTRAMUSCULAR | Status: DC | PRN
  Administered 2020-06-25: 4 mg via INTRAVENOUS

## 2020-06-25 MED ORDER — OXYCODONE HCL 5 MG PO TABS
5.0000 mg | ORAL_TABLET | Freq: Once | ORAL | Status: AC | PRN
  Administered 2020-06-25: 5 mg via ORAL

## 2020-06-25 MED ORDER — BUPIVACAINE HCL 0.5 % IJ SOLN
INTRAMUSCULAR | Status: DC | PRN
  Administered 2020-06-25: 16 mL

## 2020-06-25 MED ORDER — LIDOCAINE-EPINEPHRINE 1 %-1:100000 IJ SOLN
INTRAMUSCULAR | Status: AC
  Filled 2020-06-25: qty 1

## 2020-06-25 MED ORDER — SUGAMMADEX SODIUM 200 MG/2ML IV SOLN
INTRAVENOUS | Status: DC | PRN
  Administered 2020-06-25: 30 mg via INTRAVENOUS
  Administered 2020-06-25 (×3): 50 mg via INTRAVENOUS

## 2020-06-25 MED ORDER — LIDOCAINE-EPINEPHRINE 1 %-1:100000 IJ SOLN
INTRAMUSCULAR | Status: DC | PRN
  Administered 2020-06-25: 10 mL

## 2020-06-25 MED ORDER — DEXMEDETOMIDINE (PRECEDEX) IN NS 20 MCG/5ML (4 MCG/ML) IV SYRINGE
PREFILLED_SYRINGE | INTRAVENOUS | Status: DC | PRN
  Administered 2020-06-25 (×5): 4 ug via INTRAVENOUS

## 2020-06-25 MED ORDER — FENTANYL CITRATE (PF) 100 MCG/2ML IJ SOLN
25.0000 ug | INTRAMUSCULAR | Status: DC | PRN
  Administered 2020-06-25 (×3): 25 ug via INTRAVENOUS

## 2020-06-25 MED ORDER — SODIUM CHLORIDE 0.9 % IR SOLN: Status: DC | PRN

## 2020-06-25 MED ORDER — LIDOCAINE HCL (CARDIAC) PF 100 MG/5ML IV SOSY
PREFILLED_SYRINGE | INTRAVENOUS | Status: DC | PRN
  Administered 2020-06-25: 100 mg via INTRAVENOUS

## 2020-06-25 MED ORDER — CEFAZOLIN SODIUM-DEXTROSE 2-4 GM/100ML-% IV SOLN
2.0000 g | Freq: Once | INTRAVENOUS | Status: AC
  Administered 2020-06-25: 2 g via INTRAVENOUS

## 2020-06-25 MED ORDER — ROCURONIUM BROMIDE 10 MG/ML (PF) SYRINGE
PREFILLED_SYRINGE | INTRAVENOUS | Status: AC
  Filled 2020-06-25: qty 10

## 2020-06-25 MED ORDER — KETOROLAC TROMETHAMINE 30 MG/ML IJ SOLN
INTRAMUSCULAR | Status: AC
  Filled 2020-06-25: qty 1

## 2020-06-25 MED ORDER — ROCURONIUM BROMIDE 100 MG/10ML IV SOLN
INTRAVENOUS | Status: DC | PRN
  Administered 2020-06-25: 20 mg via INTRAVENOUS
  Administered 2020-06-25 (×2): 10 mg via INTRAVENOUS
  Administered 2020-06-25: 50 mg via INTRAVENOUS
  Administered 2020-06-25: 10 mg via INTRAVENOUS

## 2020-06-25 MED ORDER — LACTATED RINGERS IV SOLN: INTRAVENOUS | Status: DC | PRN

## 2020-06-25 MED ORDER — CEFAZOLIN SODIUM-DEXTROSE 2-4 GM/100ML-% IV SOLN
INTRAVENOUS | Status: AC
  Filled 2020-06-25: qty 100

## 2020-06-25 MED ORDER — GLYCOPYRROLATE 0.2 MG/ML IJ SOLN
INTRAMUSCULAR | Status: DC | PRN
  Administered 2020-06-25: .2 mg via INTRAVENOUS

## 2020-06-25 MED ORDER — OXYCODONE-ACETAMINOPHEN 5-325 MG PO TABS: 1.0000 | ORAL_TABLET | Freq: Four times a day (QID) | ORAL | Status: DC | PRN

## 2020-06-25 MED ORDER — ONDANSETRON HCL 4 MG/2ML IJ SOLN
INTRAMUSCULAR | Status: AC
  Filled 2020-06-25: qty 2

## 2020-06-25 MED ORDER — ACETAMINOPHEN 500 MG PO TABS: 1000.0000 mg | ORAL_TABLET | ORAL | Status: AC

## 2020-06-25 MED ORDER — CHLORHEXIDINE GLUCONATE 0.12 % MT SOLN: 15.0000 mL | Freq: Once | OROMUCOSAL | Status: AC

## 2020-06-25 MED ORDER — GLYCOPYRROLATE 0.2 MG/ML IJ SOLN
INTRAMUSCULAR | Status: AC
  Filled 2020-06-25: qty 1

## 2020-06-25 MED ORDER — DEXMEDETOMIDINE (PRECEDEX) IN NS 20 MCG/5ML (4 MCG/ML) IV SYRINGE
PREFILLED_SYRINGE | INTRAVENOUS | Status: AC
  Filled 2020-06-25: qty 5

## 2020-06-25 MED ORDER — CHLORHEXIDINE GLUCONATE 0.12 % MT SOLN
OROMUCOSAL | Status: AC
  Administered 2020-06-25: 15 mL via OROMUCOSAL
  Filled 2020-06-25: qty 15

## 2020-06-25 MED ORDER — GABAPENTIN 300 MG PO CAPS
ORAL_CAPSULE | ORAL | Status: AC
  Administered 2020-06-25: 300 mg via ORAL
  Filled 2020-06-25: qty 1

## 2020-06-25 MED ORDER — OXYCODONE HCL 5 MG PO TABS
ORAL_TABLET | ORAL | Status: DC
Start: 2020-06-25 — End: 2020-06-25
  Filled 2020-06-25: qty 1

## 2020-06-25 MED ORDER — FAMOTIDINE 20 MG PO TABS: 20.0000 mg | ORAL_TABLET | Freq: Once | ORAL | Status: AC

## 2020-06-25 MED ORDER — ORAL CARE MOUTH RINSE: 15.0000 mL | Freq: Once | OROMUCOSAL | Status: AC

## 2020-06-25 MED ORDER — ACETAMINOPHEN 500 MG PO TABS
ORAL_TABLET | ORAL | Status: AC
  Administered 2020-06-25: 1000 mg via ORAL
  Filled 2020-06-25: qty 2

## 2020-06-25 SURGICAL SUPPLY — 68 items
APL PRP STRL LF DISP 70% ISPRP (MISCELLANEOUS) ×2
APL SRG 38 LTWT LNG FL B (MISCELLANEOUS) ×2
APPLICATOR ARISTA FLEXITIP XL (MISCELLANEOUS) ×4 IMPLANT
BAG DRN RND TRDRP ANRFLXCHMBR (UROLOGICAL SUPPLIES) ×2
BAG SPEC RTRVL LRG 6X4 10 (ENDOMECHANICALS) ×2
BAG URINE DRAIN 2000ML AR STRL (UROLOGICAL SUPPLIES) ×4 IMPLANT
BLADE SURG SZ11 CARB STEEL (BLADE) ×4 IMPLANT
CANISTER SUCT 1200ML W/VALVE (MISCELLANEOUS) ×4 IMPLANT
CATH FOLEY 2WAY  5CC 16FR (CATHETERS) ×2
CATH FOLEY 2WAY 5CC 16FR (CATHETERS) ×2
CATH ROBINSON RED A/P 16FR (CATHETERS) ×4 IMPLANT
CATH URTH 16FR FL 2W BLN LF (CATHETERS) ×2 IMPLANT
CHLORAPREP W/TINT 26 (MISCELLANEOUS) ×4 IMPLANT
CLOSURE WOUND 1/2 X4 (GAUZE/BANDAGES/DRESSINGS) ×1
CLOSURE WOUND 1/4X4 (GAUZE/BANDAGES/DRESSINGS) ×1
COVER WAND RF STERILE (DRAPES) ×4 IMPLANT
DRAPE SURG 17X11 SM STRL (DRAPES) ×4 IMPLANT
DRSG TEGADERM 2-3/8X2-3/4 SM (GAUZE/BANDAGES/DRESSINGS) ×16 IMPLANT
ELECT REM PT RETURN 9FT ADLT (ELECTROSURGICAL) ×4
ELECTRODE REM PT RTRN 9FT ADLT (ELECTROSURGICAL) ×2 IMPLANT
FILTER LAP SMOKE EVAC STRL (MISCELLANEOUS) ×4 IMPLANT
GAUZE 4X4 16PLY RFD (DISPOSABLE) ×4 IMPLANT
GLOVE SURG SYN 8.0 (GLOVE) ×12 IMPLANT
GOWN STRL REUS W/ TWL LRG LVL3 (GOWN DISPOSABLE) ×20 IMPLANT
GOWN STRL REUS W/ TWL XL LVL3 (GOWN DISPOSABLE) ×6 IMPLANT
GOWN STRL REUS W/TWL LRG LVL3 (GOWN DISPOSABLE) ×40
GOWN STRL REUS W/TWL XL LVL3 (GOWN DISPOSABLE) ×12
GRASPER SUT TROCAR 14GX15 (MISCELLANEOUS) ×4 IMPLANT
HEMOSTAT ARISTA ABSORB 3G PWDR (HEMOSTASIS) ×4 IMPLANT
IRRIGATION STRYKERFLOW (MISCELLANEOUS) ×2 IMPLANT
IRRIGATOR STRYKERFLOW (MISCELLANEOUS) ×4
IV LACTATED RINGERS 1000ML (IV SOLUTION) ×4 IMPLANT
IV NS 1000ML (IV SOLUTION) ×4
IV NS 1000ML BAXH (IV SOLUTION) ×2 IMPLANT
KIT PINK PAD W/HEAD ARE REST (MISCELLANEOUS) ×4
KIT PINK PAD W/HEAD ARM REST (MISCELLANEOUS) ×2 IMPLANT
KIT TURNOVER CYSTO (KITS) ×4 IMPLANT
LABEL OR SOLS (LABEL) ×4 IMPLANT
NEEDLE HYPO 22GX1.5 SAFETY (NEEDLE) ×4 IMPLANT
NS IRRIG 500ML POUR BTL (IV SOLUTION) ×4 IMPLANT
PACK BASIN MINOR (MISCELLANEOUS) ×4 IMPLANT
PACK GYN LAPAROSCOPIC (MISCELLANEOUS) ×4 IMPLANT
PAD OB MATERNITY 4.3X12.25 (PERSONAL CARE ITEMS) ×4 IMPLANT
PAD PREP 24X41 OB/GYN DISP (PERSONAL CARE ITEMS) ×4 IMPLANT
POUCH SPECIMEN RETRIEVAL 10MM (ENDOMECHANICALS) ×4 IMPLANT
SET TUBE SMOKE EVAC HIGH FLOW (TUBING) ×4 IMPLANT
SHEARS HARMONIC ACE PLUS 36CM (ENDOMECHANICALS) ×4 IMPLANT
SLEEVE ENDOPATH XCEL 5M (ENDOMECHANICALS) ×12 IMPLANT
SPONGE GAUZE 2X2 8PLY STER LF (GAUZE/BANDAGES/DRESSINGS) ×3
SPONGE GAUZE 2X2 8PLY STRL LF (GAUZE/BANDAGES/DRESSINGS) ×9 IMPLANT
STRIP CLOSURE SKIN 1/2X4 (GAUZE/BANDAGES/DRESSINGS) ×3 IMPLANT
STRIP CLOSURE SKIN 1/4X4 (GAUZE/BANDAGES/DRESSINGS) ×3 IMPLANT
SUT PDS 2-0 27IN (SUTURE) ×4 IMPLANT
SUT VIC AB 0 CT1 27 (SUTURE) ×8
SUT VIC AB 0 CT1 27XCR 8 STRN (SUTURE) ×4 IMPLANT
SUT VIC AB 0 CT1 36 (SUTURE) ×8 IMPLANT
SUT VIC AB 0 CT2 27 (SUTURE) ×4 IMPLANT
SUT VIC AB 2-0 SH 27 (SUTURE) ×4
SUT VIC AB 2-0 SH 27XBRD (SUTURE) ×2 IMPLANT
SUT VIC AB 2-0 UR6 27 (SUTURE) ×4 IMPLANT
SUT VIC AB 4-0 SH 27 (SUTURE) ×4
SUT VIC AB 4-0 SH 27XANBCTRL (SUTURE) ×2 IMPLANT
SWABSTK COMLB BENZOIN TINCTURE (MISCELLANEOUS) ×4 IMPLANT
SYR 10ML LL (SYRINGE) ×4 IMPLANT
SYR CONTROL 10ML LL (SYRINGE) ×4 IMPLANT
TROCAR ENDO BLADELESS 11MM (ENDOMECHANICALS) ×4 IMPLANT
TROCAR XCEL NON-BLD 5MMX100MML (ENDOMECHANICALS) ×4 IMPLANT
TUBING EVAC SMOKE HEATED PNEUM (TUBING) ×4 IMPLANT

## 2020-06-25 NOTE — Anesthesia Preprocedure Evaluation (Signed)
Anesthesia Evaluation  Patient identified by MRN, date of birth, ID band Patient awake    Reviewed: Allergy & Precautions, H&P , NPO status , Patient's Chart, lab work & pertinent test results  Airway Mallampati: II  TM Distance: >3 FB Neck ROM: full    Dental  (+) Chipped, Poor Dentition   Pulmonary neg shortness of breath, Current Smoker and Patient abstained from smoking.,    Pulmonary exam normal        Cardiovascular Exercise Tolerance: Good (-) angina(-) Past MI and (-) DOE negative cardio ROS Normal cardiovascular exam     Neuro/Psych negative neurological ROS  negative psych ROS   GI/Hepatic negative GI ROS, Neg liver ROS, neg GERD  ,  Endo/Other  negative endocrine ROS  Renal/GU      Musculoskeletal   Abdominal   Peds  Hematology negative hematology ROS (+)   Anesthesia Other Findings Past Medical History: No date: Patient denies medical problems No date: Sinus congestion  Past Surgical History: No date: ANKLE SURGERY     Reproductive/Obstetrics negative OB ROS                             Anesthesia Physical Anesthesia Plan  ASA: II  Anesthesia Plan: General ETT   Post-op Pain Management:    Induction: Intravenous  PONV Risk Score and Plan: Ondansetron, Dexamethasone, Midazolam and Treatment may vary due to age or medical condition  Airway Management Planned: Oral ETT  Additional Equipment:   Intra-op Plan:   Post-operative Plan: Extubation in OR  Informed Consent: I have reviewed the patients History and Physical, chart, labs and discussed the procedure including the risks, benefits and alternatives for the proposed anesthesia with the patient or authorized representative who has indicated his/her understanding and acceptance.     Dental Advisory Given  Plan Discussed with: Anesthesiologist, CRNA and Surgeon  Anesthesia Plan Comments: (Patient consented  for risks of anesthesia including but not limited to:  - adverse reactions to medications - damage to eyes, teeth, lips or other oral mucosa - nerve damage due to positioning  - sore throat or hoarseness - Damage to heart, brain, nerves, lungs, other parts of body or loss of life  Patient voiced understanding.)        Anesthesia Quick Evaluation

## 2020-06-25 NOTE — Progress Notes (Signed)
Pt is ready for LAVH and bilateral salpingectomy. Possible right oophorectomy . She is aware that future fertility is not an option after this procedure . All questions answered . Proceed .

## 2020-06-25 NOTE — Anesthesia Procedure Notes (Signed)
Procedure Name: Intubation Date/Time: 06/25/2020 7:44 AM Performed by: Gayland Curry, CRNA Pre-anesthesia Checklist: Patient identified, Emergency Drugs available, Suction available and Patient being monitored Patient Re-evaluated:Patient Re-evaluated prior to induction Oxygen Delivery Method: Circle system utilized Preoxygenation: Pre-oxygenation with 100% oxygen Induction Type: IV induction Ventilation: Mask ventilation without difficulty Laryngoscope Size: Mac and 3 Grade View: Grade I Tube type: Oral Number of attempts: 1 Placement Confirmation: ETT inserted through vocal cords under direct vision,  positive ETCO2 and breath sounds checked- equal and bilateral Secured at: 22 cm Tube secured with: Tape Dental Injury: Teeth and Oropharynx as per pre-operative assessment

## 2020-06-25 NOTE — Transfer of Care (Signed)
Immediate Anesthesia Transfer of Care Note  Patient: Teresa Mclean  Procedure(s) Performed: LAPAROSCOPIC ASSISTED VAGINAL HYSTERECTOMY WITH BILATERAL SALPINGECTOMY (Bilateral )  Patient Location: PACU  Anesthesia Type:General  Level of Consciousness: drowsy and patient cooperative  Airway & Oxygen Therapy: Patient Spontanous Breathing and Patient connected to nasal cannula oxygen  Post-op Assessment: Report given to RN and Post -op Vital signs reviewed and stable  Post vital signs: Reviewed and stable  Last Vitals:  Vitals Value Taken Time  BP 117/84 06/25/20 1110  Temp 36.4 C 06/25/20 1110  Pulse 70 06/25/20 1112  Resp 15 06/25/20 1112  SpO2 99 % 06/25/20 1112  Vitals shown include unvalidated device data.  Last Pain:  Vitals:   06/25/20 0640  TempSrc: Oral  PainSc: 0-No pain         Complications: No complications documented.

## 2020-06-25 NOTE — Discharge Instructions (Addendum)
YOU HAD PAIN MED OXYCODONE AT [facility] AT 11:39 AM.    Laparoscopically Assisted Vaginal Hysterectomy, Care After This sheet gives you information about how to care for yourself after your procedure. Your health care provider may also give you more specific instructions. If you have problems or questions, contact your health care provider. What can I expect after the procedure? After the procedure, it is common to have:  Soreness and numbness in your incision areas.  Abdominal pain. You will be given pain medicine to control it.  Vaginal bleeding and discharge. You will need to use a sanitary napkin after this procedure.  Sore throat from the breathing tube that was inserted during surgery. Follow these instructions at home: Medicines  Take over-the-counter and prescription medicines only as told by your health care provider.  Do not take aspirin or ibuprofen. These medicines can cause bleeding.  Do not drive or use heavy machinery while taking prescription pain medicine.  Do not drive for 24 hours if you were given a medicine to help you relax (sedative) during the procedure. Incision care   Follow instructions from your health care provider about how to take care of your incisions. Make sure you: ? Wash your hands with soap and water before you change your bandage (dressing). If soap and water are not available, use hand sanitizer. ? Change your dressing as told by your health care provider. ? Leave stitches (sutures), skin glue, or adhesive strips in place. These skin closures may need to stay in place for 2 weeks or longer. If adhesive strip edges start to loosen and curl up, you may trim the loose edges. Do not remove adhesive strips completely unless your health care provider tells you to do that.  Check your incision area every day for signs of infection. Check for: ? Redness, swelling, or pain. ? Fluid or blood. ? Warmth. ? Pus or a bad smell. Activity  Get  regular exercise as told by your health care provider. You may be told to take short walks every day and go farther each time.  Return to your normal activities as told by your health care provider. Ask your health care provider what activities are safe for you.  Do not douche, use tampons, or have sexual intercourse for at least 6 weeks, or until your health care provider gives you permission.  Do not lift anything that is heavier than 10 lb (4.5 kg), or the limit that your health care provider tells you, until he or she says that it is safe. General instructions  Do not take baths, swim, or use a hot tub until your health care provider approves. Take showers instead of baths.  Do not drive for 24 hours if you received a sedative.  Do not drive or operate heavy machinery while taking prescription pain medicine.  To prevent or treat constipation while you are taking prescription pain medicine, your health care provider may recommend that you: ? Drink enough fluid to keep your urine clear or pale yellow. ? Take over-the-counter or prescription medicines. ? Eat foods that are high in fiber, such as fresh fruits and vegetables, whole grains, and beans. ? Limit foods that are high in fat and processed sugars, such as fried and sweet foods.  Keep all follow-up visits as told by your health care provider. This is important. Contact a health care provider if:  You have signs of infection, such as: ? Redness, swelling, or pain around your incision sites. ? Fluid  or blood coming from an incision. ? An incision that feels warm to the touch. ? Pus or a bad smell coming from an incision.  Your incision breaks open.  Your pain medicine is not helping.  You feel dizzy or light-headed.  You have pain or bleeding when you urinate.  You have persistent nausea and vomiting.  You have blood, pus, or a bad-smelling discharge from your vagina. Get help right away if:  You have a fever.  You  have severe abdominal pain.  You have chest pain.  You have shortness of breath.  You faint.  You have pain, swelling, or redness in your leg.  You have heavy bleeding from your vagina. Summary  After the procedure, it is common to have abdominal pain and vaginal bleeding.  You should not drive or lift heavy objects until your health care provider says that it is safe.  Contact your health care provider if you have any symptoms of infection, excessive vaginal bleeding, nausea, vomiting, or shortness of breath. This information is not intended to replace advice given to you by your health care provider. Make sure you discuss any questions you have with your health care provider. Document Revised: 09/07/2017 Document Reviewed: 11/21/2016 Elsevier Patient Education  2020 [employer]   1) The drugs that you were given will stay in your system until tomorrow so for the next 24 hours you should not:  A) Drive an automobile B) Make any legal decisions C) Drink any alcoholic beverage   2) You may resume regular meals tomorrow.  Today it is better to start with liquids and gradually work up to solid foods.  You may eat anything you prefer, but it is better to start with liquids, then soup and crackers, and gradually work up to solid foods.   3) Please notify your doctor immediately if you have any unusual bleeding, trouble breathing, redness and pain at the surgery site, drainage, fever, or pain not relieved by medication.    4) Additional Instructions:        Please contact your physician with any problems or Same Day Surgery at [phone], Monday through Friday 6 am to 4 pm, or [place] at [facility] number at [phone].

## 2020-06-25 NOTE — Brief Op Note (Signed)
06/25/2020  10:48 AM  PATIENT:  Teresa Mclean  39 y.o. 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  Left hydrosalpinx endometriosis  PROCEDURE:  LAVH , bilateral salpingectomy  Extensive pelvic adhesio lysis Excision of pelvic endometriosis   SURGEON:  Surgeon(s) and Role:    * Lizandro Spellman, Gwen Her, MD - Primary    * Benjaman Kindler, MD - Assisting  PHYSICIAN ASSISTANT: CST   ASSISTANTS: none   ANESTHESIA:   general  EBL:  100 mL IOF 1500 cc UO 400 cc  BLOOD ADMINISTERED:none  DRAINS:  LOCAL MEDICATIONS USED:  MARCAINE     SPECIMEN:  Source of Specimen:  cx uterus , bilateral fallopian tubes , peritoneal excision   DISPOSITION OF SPECIMEN:  PATHOLOGY  COUNTS:  YES  TOURNIQUET:  * No tourniquets in log *  DICTATION: .Other Dictation: Dictation Number verbal  PLAN OF CARE: Discharge to home after PACU  PATIENT DISPOSITION:  PACU - hemodynamically stable.   Delay start of Pharmacological VTE agent (>24hrs) due to surgical blood loss or risk of bleeding: not applicable

## 2020-06-25 NOTE — Op Note (Signed)
NAME: Teresa Mclean, Teresa Mclean MEDICAL RECORD WV:[id number] ACCOUNT [id number] DATE OF BIRTH:Mar 02, 1981 FACILITY: [facility] LOCATION: [facility] PHYSICIAN:Jodi Criscuolo Josefine Class, MD  OPERATIVE REPORT  DATE OF PROCEDURE:  06/25/2020  PREOPERATIVE DIAGNOSES: 1.  Chronic pelvic pain. 2.  Cervical dysplasia.  POSTOPERATIVE DIAGNOSES: 1.  Chronic pelvic pain. 2.  Cervical dysplasia. 3.  Significant pelvic adhesive disease. 4.  Left hydrosalpinx. 5.  Endometriosis, right ovarian fossa.  PROCEDURES: 1.  Laparoscopic-assisted vaginal hysterectomy. 2.  Bilateral salpingectomy. 3.  Significant pelvic adhesiolysis, entailing greater than 50% of total operating time. 4.  Excision of endometriosis in the right ovarian.  SURGEON:  Laverta Baltimore, MD.  FIRST ASSISTANT:  Benjaman Kindler, MD  INDICATIONS:  A 39 year old gravida 1, para 1 patient with a greater than 5-year history of chronic pelvic pain, right side greater than the left.  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  The patient  denies desiring future fertility.  DESCRIPTION OF PROCEDURE:  After adequate general endotracheal anesthesia, the patient was placed in dorsal supine position with the legs in the Chesaning stirrups.  The patient's abdomen was prepped and draped in normal sterile fashion.  Timeout was  performed.  The patient did receive 2 g IV Ancef prior to commencement of the case.  A speculum was placed in the vagina and the anterior cervix was grasped with a single-tooth tenaculum and a Kahn cannula was placed in the endocervical canal.  This was  to be used for uterine manipulation during the procedure.  Gloves were changed, gown was changed and attention was directed to the patient's abdomen where a 5 mm infraumbilical incision was made after injecting with 0.5% Marcaine.  The 5 mm laparoscope  was advanced into the abdominal cavity under direct visualization with the  Optiview cannula.  The patient's abdomen was insufflated.  Second port placement was placed in the right lower quadrant 3 cm medial to the right anterior iliac spine and a 5 mm  trocar was advanced under direct visualization. 3rd port 5 mm placed LLQ under direct visualization . 4th port placed 2 cm above symphysis 5 mm  Initial impression revealed marked adhesions of the bilateral adnexa.  Left adnexa with a dilated fallopian tube with significant adhesions to the sigmoid colon and to the ovary.  The right fallopian tube  demonstrated adhesions to the ovary and to the right side of the uterus.  There was a large Allen-Masters window noted at the right ovarian fossa.  The ureter on the right was identified with normal peristaltic activity and tracked just inferior to this  Allen-Masters window.  The Harmonic scalpel was brought up to the operative field and greater than 50% of total operating time involved lysis of the adhesions on the bilateral adnexa, with removal of the left fallopian tube with marked scarring to the  ovary and sigmoid bowel.  Ultimately, the left fallopian tube was dissected free and was excised totally and placed into the posterior cul-de-sac to be retrieved during the vaginal portion of the procedure.  Good hemostasis was noted.  Significant scar  tissue obscured what appeared to be the remnant of the left ovary.  Ureter peristaltic activity was identified post-dissection on the left.  Similar procedure was repeated on the patient's right side, again with a significant adhesiolysis, freeing the  right fallopian tube from the ovary.  The ovary did demonstrate some oozing, which was controlled with the Kleppinger cautery.  The right fallopian tube was draped over into the  anterior cul-de-sac to visualize the right ovarian fossa Allen-Masters  window.  This was picked up with [place] graspers and the peritoneum was placed on traction and a representative excision of this window was  performed and this tissue will be sent to pathology for identification.  Attention was then directed to the left  side again where the round ligament was cauterized and opened and the uteroovarian ligament was cauterized, transected, followed by skeletization of the left uterine artery.  The left uterine artery was then cauterized with the Kleppinger cautery and  transected.  Anterior bladder flap was created with sharp and blunt dissection.  Attention was directed to the patient's right round ligament, which was cauterized and opened with Harmonic scalpel and the uteroovarian ligament was then cauterized and  transected and the broad ligament was then dissected with skeletization of the right uterine artery.  The uterine artery was then cauterized and transected.  The right ovary did have some oozing, which was controlled with the Kleppinger cautery.  The  omental adhesion was noted to the anterior abdominal wall, which was taken down with Harmonic scalpel.  Attention was then directed vaginally with the legs placed upwards.  The weighted speculum was placed in the posterior vaginal vault and then the  single-tooth tenaculum and Kahn cannula were removed and replaced with 2 thyroid tenacula.  Cervix was then circumferentially injected with 1% lidocaine with 1:100,000 epinephrine.  A direct posterior colpotomy incision was made.  Upon entry into the  posterior cul-de-sac, a long weighted bill speculum was placed.  The uterosacral ligaments were bilaterally clamped, transected, suture ligated and held for later identification.  Anterior cervix was circumferentially incised with the Bovie.  The  anterior cul-de-sac was entered sharply without difficulty and the Deaver retractor was placed within to elevate the bladder anteriorly.  Of note, the bladder was drained initially before the laparoscopic portion of the procedure was performed and the  Foley catheter was removed prior to dissection of the vaginal  tissues.  The cardinal ligaments were then bilaterally clamped, transected, suture ligated with 0 Vicryl suture.  One additional clamp on both sides allowed for delivery of the cervix and the  uterus.  The right fallopian tube and the left fallopian tube was retrieved from the posterior cul-de-sac.  Two additional figure-of-eight sutures were required to the right sidewall for some oozing.  The peritoneum was then closed with a 2-0 PDS suture  in a pursestring fashion.  The vaginal vault was closed with a running 0 Vicryl suture with reapproximation of the uterosacral ligament centrally and the rest of the vault was closed.  Good hemostasis was noted.  The patient's bladder was again drained,  yielding an additional 100 mL of urine.  Total urine output at this point 400 mL.  Gloves and gowns were changed again and attention was directed to the patient's abdomen again where the abdomen was insufflated and direct visualization of the pelvic  structures.  The right ovary had a small amount of oozing, which was controlled with the Kleppinger cautery and the Allen-Masters window dissection site was then evaluated.  Pressure was lowered to 7 mmHg and good hemostasis was noted.  Arista was placed  at the dissection of the right ovarian fossa and on the right ovary.  The upper abdomen appeared normal.  The appendix appeared normal.  Normal peristaltic activity from both ureters was identified.  The patient's abdomen was deflated and the trocar  sites, 1 on the right, 1 on the  left and 1 centrally were all repaired with 4-0 Vicryl suture.  Good hemostasis was noted.  Sterile dressing applied.  COMPLICATIONS:  There were no complications.  ESTIMATED BLOOD LOSS:  100 mL.  INTRAOPERATIVE FLUIDS:  1500 mL.  URINE OUTPUT:  400 mL.  DISPOSITION:  The patient was taken to recovery room in good condition.  VN/NUANCE  D:06/25/2020 T:06/25/2020 JOB:[id number]

## 2020-06-27 NOTE — Anesthesia Postprocedure Evaluation (Signed)
Anesthesia Post Note  Patient: Teresa Mclean  Procedure(s) Performed: LAPAROSCOPIC ASSISTED VAGINAL HYSTERECTOMY WITH BILATERAL SALPINGECTOMY (Bilateral )  Patient location during evaluation: PACU Anesthesia Type: General Level of consciousness: awake and alert Pain management: pain level controlled Vital Signs Assessment: post-procedure vital signs reviewed and stable Respiratory status: spontaneous breathing, nonlabored ventilation, respiratory function stable and patient connected to nasal cannula oxygen Cardiovascular status: blood pressure returned to baseline and stable Postop Assessment: no apparent nausea or vomiting Anesthetic complications: no   No complications documented.   Last Vitals:  Vitals:   06/25/20 1225 06/25/20 1242  BP: 119/70 136/80  Pulse: 71 69  Resp: 14 16  Temp: 36.9 C 36.7 C  SpO2: 97% 99%    Last Pain:  Vitals:   06/25/20 1242  TempSrc: Temporal  PainSc: 4                  Precious Haws [name]

## 2020-06-29 LAB — SURGICAL PATHOLOGY

## 2020-07-05 DIAGNOSIS — Z021 Encounter for pre-employment examination: Secondary | ICD-10-CM | POA: Insufficient documentation

## 2021-06-11 IMAGING — CT CT ABD-PELV W/ CM
2 of 4 series · 17 of 46 positions shown, 19 images · IV contrast (APPLIED)
Comparison: February 18, 2009

CLINICAL DATA: Six months of lower back and lower abdominal pain,
worsening today.

EXAM:
CT ABDOMEN AND PELVIS WITH CONTRAST
TECHNIQUE: Multidetector CT imaging of the abdomen and pelvis was performed
using the standard protocol following bolus administration of
intravenous contrast.
CONTRAST:  100mL OMNIPAQUE IOHEXOL 300 MG/ML  SOLN

[Series 2: routine abd/pel with · axial · 0.71mm/px · z∈[-841,-421]mm · 14 of 92 slices shown, 16 images]
[im 4/92  soft-tissue]
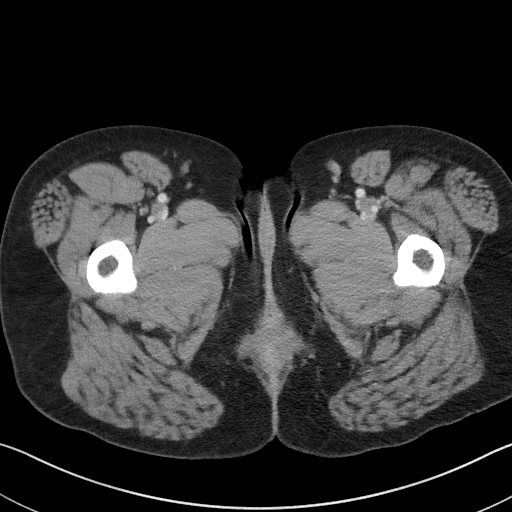
[im 4/92  bone]
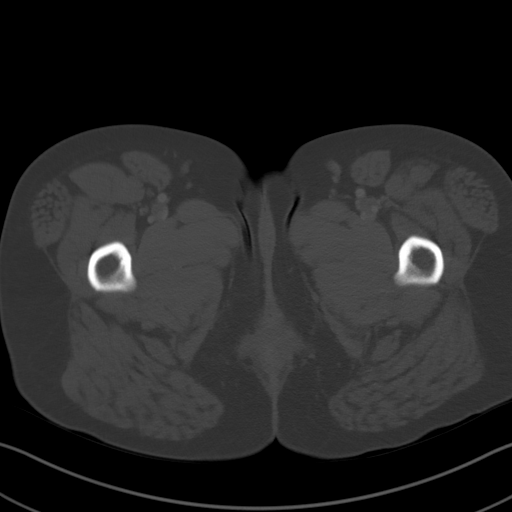
[im 12/92  soft-tissue]
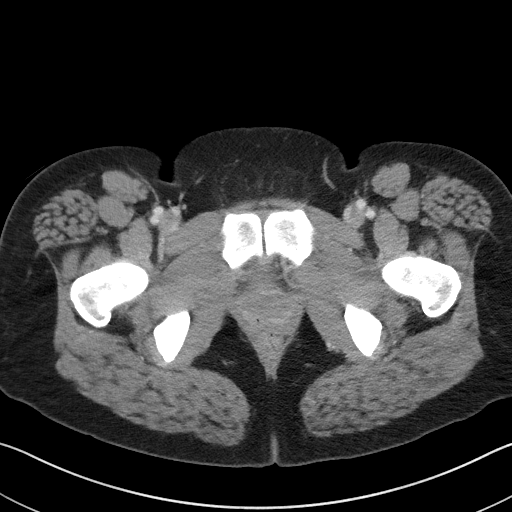
[im 19/92  soft-tissue]
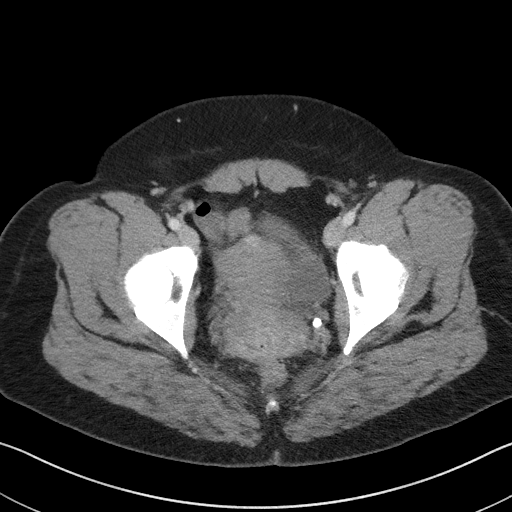
[im 23/92  soft-tissue]
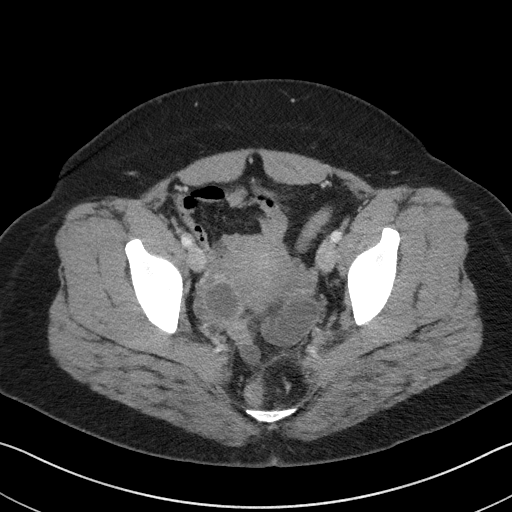
[im 31/92  soft-tissue]
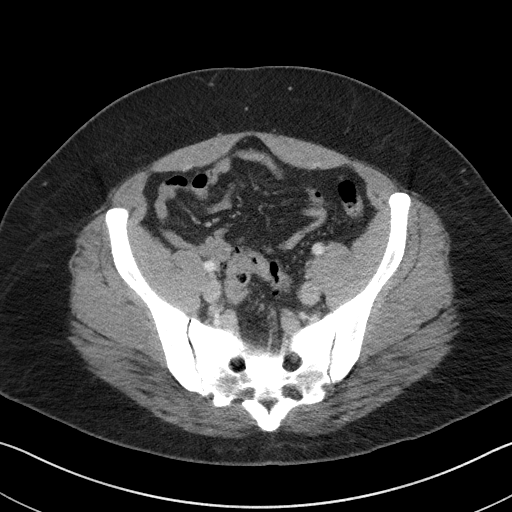
[im 38/92  soft-tissue]
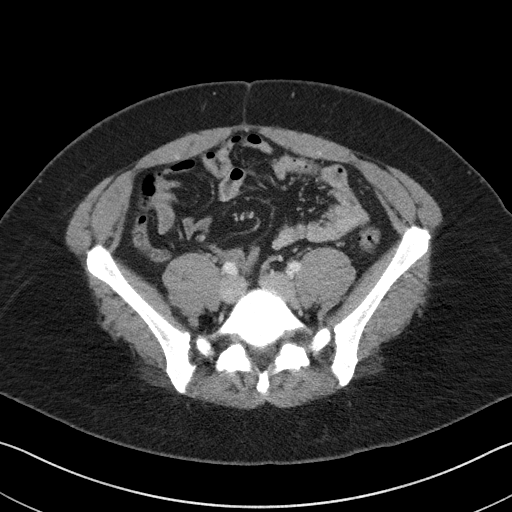
[im 42/92  soft-tissue]
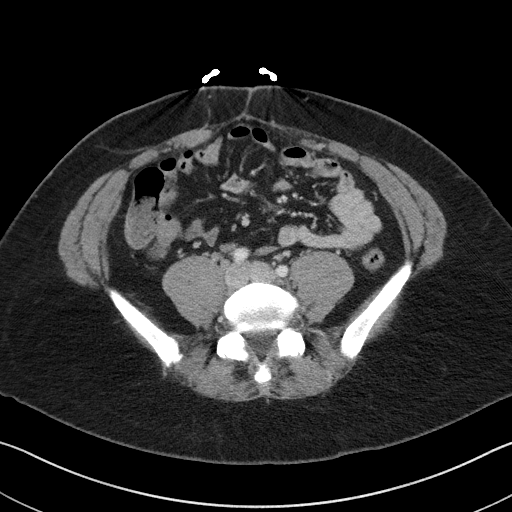
[im 50/92  soft-tissue]
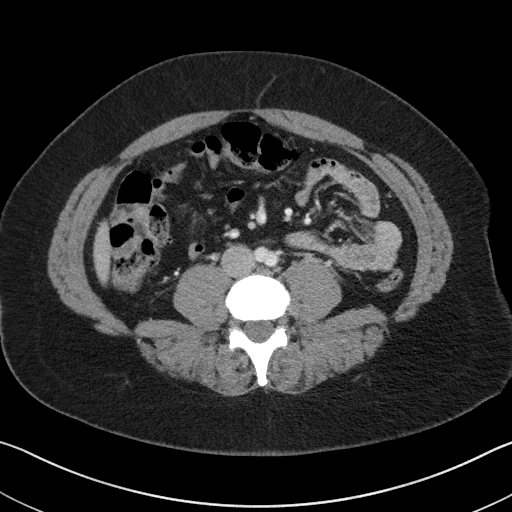
[im 54/92  soft-tissue]
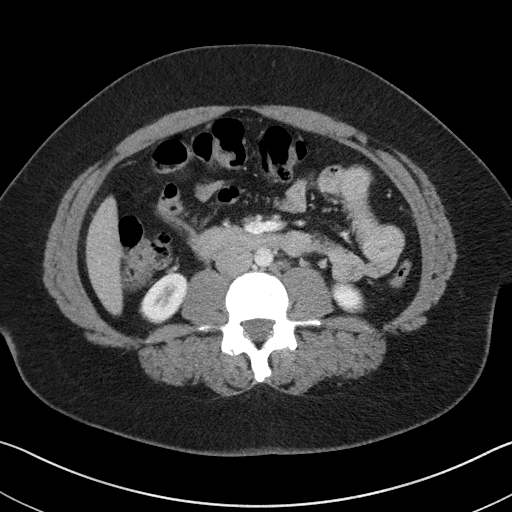
[im 54/92  bone]
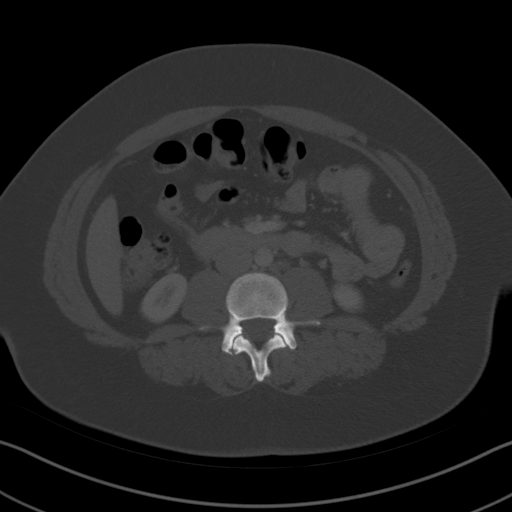
[im 61/92  soft-tissue]
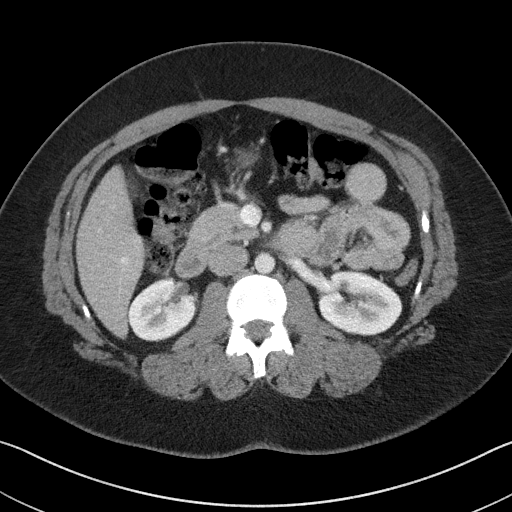
[im 69/92  soft-tissue]
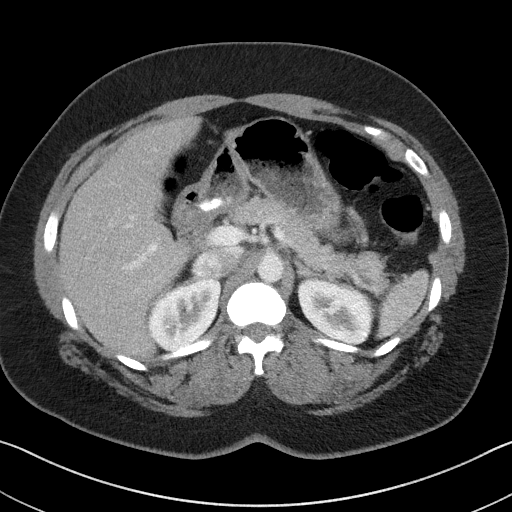
[im 73/92  soft-tissue]
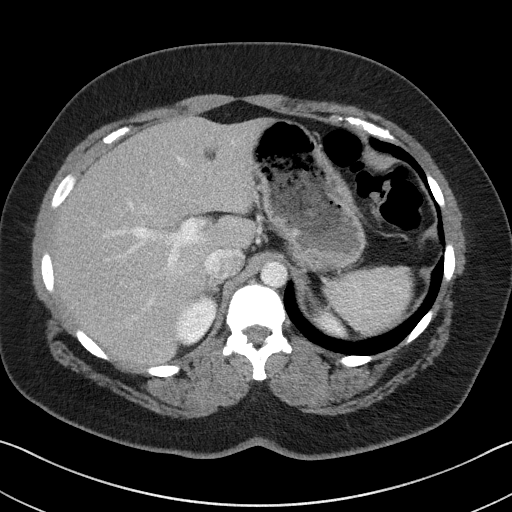
[im 80/92  soft-tissue]
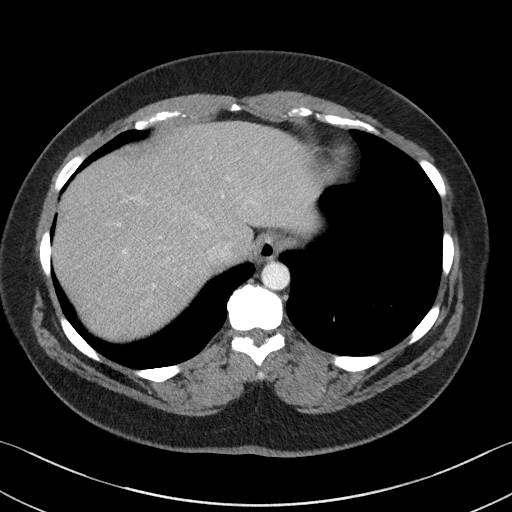
[im 88/92  soft-tissue]
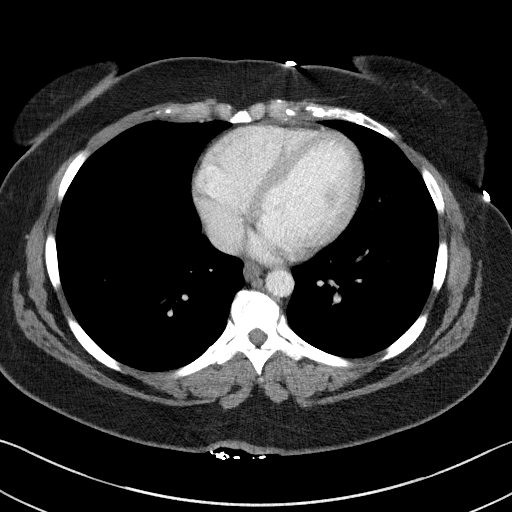

[Series 5: coronal st · coronal · 0.83mm/px · 3 of 104 slices shown]
[im 35/104  soft-tissue]
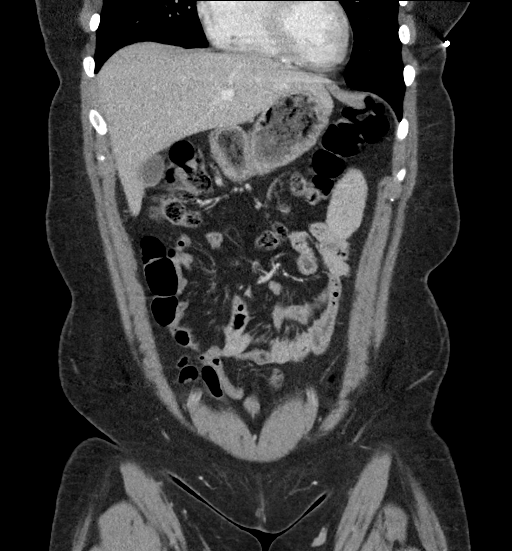
[im 46/104  soft-tissue]
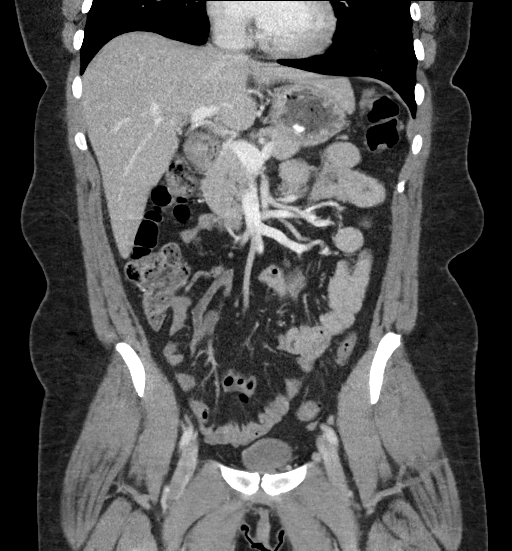
[im 58/104  soft-tissue]
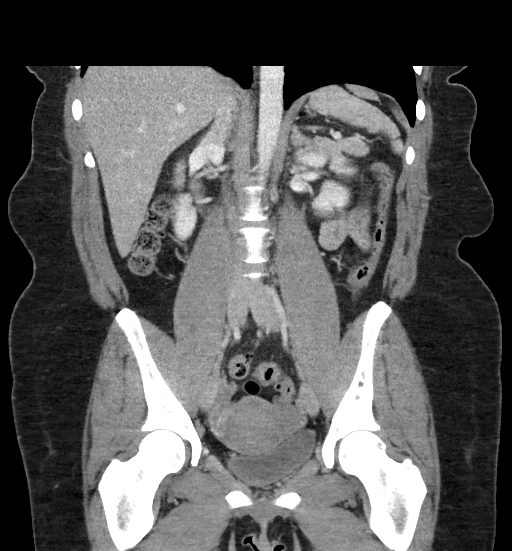

[17 of 46 positions shown; findings below may reference images not displayed]

FINDINGS: Lower chest: No acute abnormality.

Hepatobiliary: Mild hepatic steatosis. The liver, gallbladder, and
portal vein are otherwise normal.

Pancreas: Unremarkable. No pancreatic ductal dilatation or
surrounding inflammatory changes.

Spleen: Normal in size without focal abnormality.

Adrenals/Urinary Tract: Adrenal glands are unremarkable. Kidneys are
normal, without renal calculi, focal lesion, or hydronephrosis.
Bladder is unremarkable.

Stomach/Bowel: The stomach, small bowel, and colon are normal. No
evidence of appendicitis.

Vascular/Lymphatic: No significant vascular findings are present. No
enlarged abdominal or pelvic lymph nodes.

Reproductive: Uterus is normal in appearance. Dominant follicles are
seen in both ovaries with a 2.8 cm cyst seen on the right and a
cm cyst seen on the left.

Other: No abdominal wall hernia or abnormality. No abdominopelvic
ascites.

Musculoskeletal: No acute or significant osseous findings.
IMPRESSION: 1. Dominant cysts/follicles in both ovaries. The largest is seen on
the left measuring 4.4 cm. The ovaries could be better assessed with
pelvic ultrasound. Recommend a follow-up ultrasound in 6-12 weeks to
ensure resolution.
2. No other abnormalities.

## 2021-07-07 DIAGNOSIS — M25562 Pain in left knee: Secondary | ICD-10-CM | POA: Insufficient documentation

## 2021-07-07 DIAGNOSIS — M25519 Pain in unspecified shoulder: Secondary | ICD-10-CM | POA: Insufficient documentation

## 2022-07-21 DIAGNOSIS — Z832 Family history of diseases of the blood and blood-forming organs and certain disorders involving the immune mechanism: Secondary | ICD-10-CM | POA: Insufficient documentation

## 2022-08-16 ENCOUNTER — Other Ambulatory Visit: Payer: Self-pay

## 2022-08-16 ENCOUNTER — Encounter: Payer: Self-pay | Admitting: Emergency Medicine

## 2022-08-16 ENCOUNTER — Emergency Department
Admission: EM | Admit: 2022-08-16 | Discharge: 2022-08-16 | Disposition: A | Discharge status: Home / Self Care | Payer: Commercial Managed Care - PPO | Attending: Emergency Medicine | Admitting: Emergency Medicine

## 2022-08-16 ENCOUNTER — Emergency Department: Payer: Commercial Managed Care - PPO

## 2022-08-16 DIAGNOSIS — Z1152 Encounter for screening for COVID-19: Secondary | ICD-10-CM | POA: Diagnosis not present

## 2022-08-16 DIAGNOSIS — N611 Abscess of the breast and nipple: Secondary | ICD-10-CM | POA: Insufficient documentation

## 2022-08-16 DIAGNOSIS — J069 Acute upper respiratory infection, unspecified: Secondary | ICD-10-CM | POA: Diagnosis not present

## 2022-08-16 DIAGNOSIS — L0291 Cutaneous abscess, unspecified: Secondary | ICD-10-CM

## 2022-08-16 DIAGNOSIS — J029 Acute pharyngitis, unspecified: Secondary | ICD-10-CM | POA: Diagnosis present

## 2022-08-16 LAB — GROUP A STREP BY PCR: Group A Strep by PCR: NOT DETECTED

## 2022-08-16 LAB — RESP PANEL BY RT-PCR (RSV, FLU A&B, COVID)  RVPGX2
Influenza A by PCR: NEGATIVE
Influenza B by PCR: NEGATIVE
Resp Syncytial Virus by PCR: NEGATIVE
SARS Coronavirus 2 by RT PCR: NEGATIVE

## 2022-08-16 MED ORDER — DOXYCYCLINE MONOHYDRATE 100 MG PO TABS: 100.0000 mg | ORAL_TABLET | Freq: Two times a day (BID) | ORAL | 0 refills | Status: AC

## 2022-08-16 MED ORDER — LIDOCAINE HCL (PF) 1 % IJ SOLN
5.0000 mL | Freq: Once | INTRAMUSCULAR | Status: AC
  Administered 2022-08-16: 5 mL
  Filled 2022-08-16: qty 5

## 2022-08-16 MED ORDER — ACETAMINOPHEN 325 MG PO TABS
650.0000 mg | ORAL_TABLET | Freq: Once | ORAL | Status: AC
  Administered 2022-08-16: 650 mg via ORAL
  Filled 2022-08-16: qty 2

## 2022-08-16 MED ORDER — CEPHALEXIN 500 MG PO CAPS: 500.0000 mg | ORAL_CAPSULE | Freq: Four times a day (QID) | ORAL | 0 refills | Status: AC

## 2022-08-16 NOTE — Discharge Instructions (Signed)
Your COVID, flu, strep tests were negative.  Your chest x-ray was normal.  Your abscess was drained.  Keep the area clean and dry, wash with soap and water multiple times per day.  Take the antibiotics as prescribed.  Please return for any new, worsening, or change in symptoms or other concerns.  It was a pleasure caring for you today.

## 2022-08-16 NOTE — ED Triage Notes (Signed)
Patient to ED for sore throat x3 days. Patient also states she has a boil under her right breast along with back pain. Aox4 and ambulatory to triage.

## 2022-08-16 NOTE — ED Provider Notes (Signed)
[facility] Provider Note    Event Date/Time   First MD Initiated Contact with Patient 08/16/22 [phone]     (approximate)   History   Sore Throat and Abscess   HPI  Teresa Mclean is a 41 y.o. female presents today for evaluation of sore throat, cough, nasal congestion for the past 5 days.  She also reports that she has developed a small abscess beneath her right breast that has "come and gone" for the last couple of months.  She reports that it recently recurred over the past week.  She reports that it is very tender to touch.  She has not had any fevers or chills.  There are no problems to display for this patient.         Physical Exam   Triage Vital Signs: ED Triage Vitals [08/16/22 0915]  Enc Vitals Group     BP (!) 141/98     Pulse Rate 87     Resp 18     Temp 98.2 F (36.8 C)     Temp Source Oral     SpO2 96 %     Weight      Height      Head Circumference      Peak Flow      Pain Score 8     Pain Loc      Pain Edu?      Excl. in [place]?     Most recent vital signs: Vitals:   08/16/22 0915  BP: (!) 141/98  Pulse: 87  Resp: 18  Temp: 98.2 F (36.8 C)  SpO2: 96%    Physical Exam Vitals and nursing note reviewed.  Constitutional:      General: Awake and alert. No acute distress.    Appearance: Normal appearance. The patient is overweight.  HENT:     Head: Normocephalic and atraumatic.     Mouth: Mucous membranes are moist. Uvula midline.  No tonsillar exudate.  No soft palate fluctuance.  No trismus.  No voice change.  No sublingual swelling.  No tender cervical lymphadenopathy.  No nuchal rigidity Eyes:     General: PERRL. Normal EOMs        Right eye: No discharge.        Left eye: No discharge.     Conjunctiva/sclera: Conjunctivae normal.  Cardiovascular:     Rate and Rhythm: Normal rate and regular rhythm.     Pulses: Normal pulses.     Heart sounds: Normal heart sounds Pulmonary:     Effort: Pulmonary effort is  normal. No respiratory distress.     Breath sounds: Normal breath sounds.  Abdominal:     Abdomen is soft. There is no abdominal tenderness. No rebound or guarding. No distention. Musculoskeletal:        General: No swelling. Normal range of motion.     Cervical back: Normal range of motion and neck supple.  Skin:    General: Skin is warm and dry.     Capillary Refill: Capillary refill takes less than 2 seconds.     Findings: Medial aspect just beneath the right breast with a 1 x 1 cm fluctuant tender area.  No surrounding erythema.  No involvement of the breast tissue itself. Neurological:     Mental Status: The patient is awake and alert.      ED Results / Procedures / Treatments   Labs (all labs ordered are listed, but only abnormal results are displayed)  Labs Reviewed  GROUP A STREP BY PCR  RESP PANEL BY RT-PCR (RSV, FLU A&B, COVID)  [id number]     EKG     RADIOLOGY I independently reviewed and interpreted imaging and agree with radiologists findings.     PROCEDURES:  Critical Care performed:   Marland KitchenMarland KitchenIncision and Drainage  Date/Time: 08/16/2022 11:44 AM  Performed by: Marquette Old, PA-C Authorized by: Marquette Old, PA-C   Consent:    Consent obtained:  Verbal   Consent given by:  Patient   Risks, benefits, and alternatives were discussed: yes     Risks discussed:  Bleeding, damage to other organs, incomplete drainage, infection and pain   Alternatives discussed:  No treatment Universal protocol:    Procedure explained and questions answered to patient or proxy's satisfaction: yes     Relevant documents present and verified: yes     Test results available : yes     Imaging studies available: yes     Required blood products, implants, devices, and special equipment available: yes     Site/side marked: yes     Immediately prior to procedure, a time out was called: yes     Patient identity confirmed:  Verbally with patient Location:    Type:  Abscess   Size:   1x1cm   Location:  Trunk   Trunk location:  Chest Pre-procedure details:    Skin preparation:  Antiseptic wash Sedation:    Sedation type:  None Anesthesia:    Anesthesia method:  Local infiltration   Local anesthetic:  Lidocaine 1% w/o epi Procedure type:    Complexity:  Simple Procedure details:    Ultrasound guidance: no     Needle aspiration: no     Incision types:  Stab incision   Incision depth:  Dermal   Wound management:  Probed and deloculated and irrigated with saline   Drainage:  Purulent   Drainage amount:  Moderate   Wound treatment:  Wound left open   Packing materials:  None Post-procedure details:    Procedure completion:  Tolerated well, no immediate complications    MEDICATIONS ORDERED IN ED: Medications  lidocaine (PF) (XYLOCAINE) 1 % injection 5 mL (5 mLs Infiltration Given 08/16/22 1003)  acetaminophen (TYLENOL) tablet 650 mg (650 mg Oral Given 08/16/22 1002)     IMPRESSION / MDM / ASSESSMENT AND PLAN / ED COURSE  I reviewed the triage vital signs and the nursing notes.   Differential diagnosis includes, but is not limited to, COVID, flu, strep, pneumonia, bronchitis, abscess, cyst.  Patient is awake and alert, hemodynamically stable and afebrile.  1 x 1 cm area of fluctuance consistent with developing abscess or cyst.  Patient agreed to incision and drainage.  I&D revealed moderate purulent drainage. No small for packing. She was started on antibiotics.  Given sore throat, nasal congestion, cough patient also agreed to strep swab, COVID and flu swab, and chest x-ray.  These were all reassuring.  We discussed symptomatic management and return precautions.  Patient understands agrees with plan.  She was discharged in stable condition.   Patient's presentation is most consistent with acute complicated illness / injury requiring diagnostic workup.      FINAL CLINICAL IMPRESSION(S) / ED DIAGNOSES   Final diagnoses:  Abscess  Upper respiratory tract  infection, unspecified type     Rx / DC Orders   ED Discharge Orders          Ordered    doxycycline (ADOXA) 100 MG tablet  2 times daily        08/16/22 1055    cephALEXin (KEFLEX) 500 MG capsule  4 times daily        08/16/22 1055             Note:  This document was prepared using Dragon voice recognition software and may include unintentional dictation errors.   Emeline Gins 08/16/22 1145    Harvest Dark, MD 08/16/22 1454

## 2022-09-21 DIAGNOSIS — L930 Discoid lupus erythematosus: Secondary | ICD-10-CM | POA: Insufficient documentation

## 2022-09-21 DIAGNOSIS — H6592 Unspecified nonsuppurative otitis media, left ear: Secondary | ICD-10-CM | POA: Insufficient documentation

## 2023-02-13 DIAGNOSIS — R1084 Generalized abdominal pain: Secondary | ICD-10-CM | POA: Insufficient documentation

## 2023-02-22 ENCOUNTER — Encounter: Payer: Self-pay | Admitting: Surgery

## 2023-02-22 ENCOUNTER — Ambulatory Visit (INDEPENDENT_AMBULATORY_CARE_PROVIDER_SITE_OTHER): Payer: Commercial Managed Care - PPO | Admitting: Surgery

## 2023-02-22 VITALS — BP 120/83 | HR 87 | Temp 98.1°F | Ht 64.0 in | Wt 187.4 lb

## 2023-02-22 DIAGNOSIS — K602 Anal fissure, unspecified: Secondary | ICD-10-CM | POA: Diagnosis not present

## 2023-02-22 DIAGNOSIS — K642 Third degree hemorrhoids: Secondary | ICD-10-CM

## 2023-02-22 DIAGNOSIS — K5900 Constipation, unspecified: Secondary | ICD-10-CM

## 2023-02-22 NOTE — Progress Notes (Addendum)
Patient ID: Teresa Mclean, female   DOB: 05-13-1981, 42 y.o.   MRN: [id number]  Chief Complaint: Hemorrhoids for 1 year.  History of Present Illness Teresa Mclean is a 42 y.o. female with an episode of more severe bleeding from her known hemorrhoids over the last year, she denies pain burning or itching, however has significant pain with bowel activity.  She reports avoiding bowel movements, and moves her bowels every other day.  She is taking stool softeners and fiber in the past but has not been successful at controlling/improving her constipation.  She is tried no topical medications at all.  She has no family history of colon cancer, has never had a colonoscopy.  Past Medical History Past Medical History:  Diagnosis Date   Patient denies medical problems    Sinus congestion       Past Surgical History:  Procedure Laterality Date   ANKLE SURGERY     LAPAROSCOPIC VAGINAL HYSTERECTOMY WITH SALPINGECTOMY Bilateral 06/25/2020   Procedure: LAPAROSCOPIC ASSISTED VAGINAL HYSTERECTOMY WITH BILATERAL SALPINGECTOMY;  Surgeon: Schermerhorn, Ihor Austin, MD;  Location: [facility] ORS;  Service: Gynecology;  Laterality: Bilateral;    No Known Allergies  Current Outpatient Medications  Medication Sig Dispense Refill   docusate sodium (COLACE) 100 MG capsule Take 100 mg by mouth in the morning and at bedtime.     No current facility-administered medications for this visit.    Family History History reviewed. No pertinent family history.    Social History Social History   Tobacco Use   Smoking status: Every Day   Smokeless tobacco: Never  Vaping Use   Vaping Use: Never used  Substance Use Topics   Alcohol use: Yes    Comment: occa   Drug use: Yes    Types: Marijuana        Review of Systems  Constitutional: Negative.   HENT: Negative.    Eyes: Negative.   Respiratory: Negative.    Cardiovascular: Negative.   Gastrointestinal:  Positive for abdominal pain, blood in stool and  constipation.  Genitourinary: Negative.   Skin: Negative.   Neurological: Negative.   Psychiatric/Behavioral: Negative.       Physical Exam Blood pressure 120/83, pulse 87, temperature 98.1 F (36.7 C), temperature source Oral, height 5\' 4"  (1.626 m), weight 187 lb 6.4 oz (85 kg), last menstrual period 05/28/2020, SpO2 97 %. Last Weight  Most recent update: 02/22/2023  3:00 PM    Weight  85 kg (187 lb 6.4 oz)             CONSTITUTIONAL: Well developed, and nourished, appropriately responsive and aware without distress.   EYES: Sclera non-icteric.   EARS, NOSE, MOUTH AND THROAT:  The oropharynx is clear. Oral mucosa is pink and moist.     Hearing is intact to voice.  NECK: Trachea is midline, and there is no jugular venous distension.  LYMPH NODES:  Lymph nodes in the neck are not appreciated. RESPIRATORY:  Lungs are clear, and breath sounds are equal bilaterally.  Normal respiratory effort without pathologic use of accessory muscles. CARDIOVASCULAR: Heart is regular in rate and rhythm.   Well perfused.  GI: The abdomen is  soft, nontender, and nondistended. There were no palpable masses.  GU: DRE: [name] present as chaperone:  She has a prolapsing left sided internal hemorrhoid, and a chronic anterior fissure, with a mild rectocele.  There are grade 2 hemorrhoids on the right as well.  MUSCULOSKELETAL:  Symmetrical muscle tone appreciated in all four  extremities.    SKIN: Skin turgor is normal. No pathologic skin lesions appreciated.  NEUROLOGIC:  Motor and sensation appear grossly normal.  Cranial nerves are grossly without defect. PSYCH:  Alert and oriented to person, place and time. Affect is appropriate for situation.  Data Reviewed I have personally reviewed what is currently available of the patient's imaging, recent labs and medical records.   Labs:     Latest Ref Rng & Units 06/23/2020   11:29 AM 03/08/2020    1:31 PM  CBC  WBC 4.0 - 10.5 K/uL 9.1  7.4   Hemoglobin  12.0 - 15.0 g/dL 16.1  09.6   Hematocrit 36.0 - 46.0 % 39.5  38.9   Platelets 150 - 400 K/uL 197  181       Latest Ref Rng & Units 06/23/2020   11:29 AM 03/08/2020    1:31 PM 05/19/2016    6:43 PM  CMP  Glucose 70 - 99 mg/dL 045  82  409   BUN 6 - 20 mg/dL 10  9  9    Creatinine 0.44 - 1.00 mg/dL 8.11  9.14  7.82   Sodium 135 - 145 mmol/L 137  136  135   Potassium 3.5 - 5.1 mmol/L 3.8  4.0  3.7   Chloride 98 - 111 mmol/L 105  103  105   CO2 22 - 32 mmol/L 23  25  26    Calcium 8.9 - 10.3 mg/dL 9.6  9.3  9.2   Total Protein 6.5 - 8.1 g/dL  7.8  7.1   Total Bilirubin 0.3 - 1.2 mg/dL  0.6  0.3   Alkaline Phos 38 - 126 U/L  51  48   AST 15 - 41 U/L  18  19   ALT 0 - 44 U/L  19  18       Imaging: Radiological images reviewed:   Within last 24 hrs: No results found.  Assessment    Grade 3 hemorrhoids, with chronic anal fissure, constipation poorly controlled, currently with inadequate medical mgmt.  There are no problems to display for this patient.   Plan    Will start  Advised to pursue a goal of 25 to 30 g of fiber daily.  Made aware that the majority of this may be through natural sources, but advised to be aware of actual consumption and to ensure minimal consumption by daily supplementation.  Various forms of supplements discussed.  Recommended Psyllium husk, that mixes well with applesauce, or the powder which goes down well shaken with chocolate milk.  Strongly advised to consume more fluids to ensure adequate hydration, instructed to watch color of urine to determine adequacy of hydration.  Clarity is pursued in urine output, and bowel activity that correlates to significant meal intake.   We need to avoid deferring having bowel movements, advised to take the time at the first sign of sensation, typically following meals, and in the morning.   Subsequent utilization of MiraLAX may be needed ensure at least daily movement, ideally twice daily bowel movements.  If multiple  doses of MiraLAX are necessary utilize them. Never skip a day...  To be regular, we must do the above EVERY day.   Topically for the fissure, Nifidipine and for the hemorrhoids Recticare Advanced.   F/u in one month or PRN.    Face-to-face time spent with the patient and accompanying care providers(if present) was 35 minutes, with more than 50% of the time spent counseling, educating, and coordinating  care of the patient.    These notes generated with voice recognition software. I apologize for typographical errors.  Campbell Lerner M.D., FACS 02/22/2023, 4:35 PM

## 2023-02-22 NOTE — Patient Instructions (Addendum)
Advised to pursue a goal of 25 to 30 g of fiber daily.  Made aware that the majority of this may be through natural sources, but advised to be aware of actual consumption and to ensure minimal consumption by daily supplementation.  Various forms of supplements discussed.  Recommended Psyllium husk, that mixes well with applesauce, or the powder which goes down well shaken with chocolate milk.  Strongly advised to consume more fluids to ensure adequate hydration, instructed to watch color of urine to determine adequacy of hydration.  Clarity is pursued in urine output, and bowel activity that correlates to significant meal intake.   We need to avoid deferring having bowel movements, advised to take the time at the first sign of sensation, typically following meals, and in the morning.   Subsequent utilization of MiraLAX may be needed ensure at least daily movement, ideally twice daily bowel movements.  If multiple doses of MiraLAX are necessary utilize them. Never skip a day...  To be regular, we must do the above EVERY day.   PLEASE pick up your prescription from [facility] in [place].                                                                       [address] [place], [place] [address]                                                                      [phone]  If you have any concerns or questions, please feel free to call our office.   Hemorrhoids Hemorrhoids are swollen veins that may form: In the butt (rectum). These are called internal hemorrhoids. Around the opening of the butt (anus). These are called external hemorrhoids. Most hemorrhoids do not cause very bad problems. They often get better with changes to your lifestyle and what you eat. What are the causes? Having trouble pooping (constipation) or watery poop (diarrhea). Pushing too hard when you poop. Pregnancy. Being very overweight (obese). Sitting for too long. Riding a bike for a long time. Heavy lifting or  other things that take a lot of effort. Anal sex. What are the signs or symptoms? Pain. Itching or soreness in the butt. Bleeding from the butt. Leaking poop. Swelling. One or more lumps around the opening of your butt. How is this treated? In most cases, hemorrhoids can be treated at home. You may be told to: Change what you eat. Make changes to your lifestyle. If these treatments do not help, you may need to have a procedure done. Your doctor may need to: Place rubber bands at the bottom of the hemorrhoids to make them fall off. Put medicine into the hemorrhoids to shrink them. Shine a type of light on the hemorrhoids to cause them to fall off. Do surgery to get rid of the hemorrhoids. Follow these instructions at home: Medicines Take over-the-counter and prescription medicines only as told by your doctor. Use creams with medicine in them or medicines that  you put in your butt as told by your doctor. Eating and drinking  Eat foods that have a lot of fiber in them. These include whole grains, beans, nuts, fruits, and vegetables. Ask your doctor about taking products that have fiber added to them (fibersupplements). Take in less fat. You can do this by: Eating low-fat dairy products. Eating less red meat. Staying away from processed foods. Drink enough fluid to keep your pee (urine) pale yellow. Managing pain and swelling  Take a warm-water bath (sitz bath) for 20 minutes to ease pain. Do this 3-4 times a day. You may do this in a bathtub. You may also use a portable sitz bath that fits over the toilet. If told, put ice on the painful area. It may help to use ice between your warm baths. Put ice in a plastic bag. Place a towel between your skin and the bag. Leave the ice on for 20 minutes, 2-3 times a day. If your skin turns bright red, take off the ice right away to prevent skin damage. The risk of damage is higher if you cannot feel pain, heat, or cold. General  instructions Exercise. Ask your doctor how much and what kind of exercise is best for you. Go to the bathroom when you need to poop. Do not wait. Try not to push too hard when you poop. Keep your butt dry and clean. Use wet toilet paper or moist towelettes after you poop. Do not sit on the toilet for a long time. Contact a doctor if: You have pain and swelling that do not get better with treatment. You have trouble pooping. You cannot poop. You have pain or swelling outside the area of the hemorrhoids. Get help right away if: You have bleeding from the butt that will not stop. This information is not intended to replace advice given to you by your health care provider. Make sure you discuss any questions you have with your health care provider. Document Revised: 06/07/2022 Document Reviewed: 06/07/2022 Elsevier Patient Education  2023 [employer].

## 2023-02-23 DIAGNOSIS — K5900 Constipation, unspecified: Secondary | ICD-10-CM | POA: Insufficient documentation

## 2023-02-23 DIAGNOSIS — K602 Anal fissure, unspecified: Secondary | ICD-10-CM | POA: Insufficient documentation

## 2023-02-23 DIAGNOSIS — K642 Third degree hemorrhoids: Secondary | ICD-10-CM | POA: Insufficient documentation

## 2023-03-28 NOTE — Progress Notes (Deleted)
Patient ID: Teresa Mclean, female   DOB: 10/17/1980, 42 y.o.   MRN: [id number]  Chief Complaint: F/U of Hemorrhoids.  History of Present Illness *** Previously: LAVON SCERBO is a 42 y.o. female with an episode of more severe bleeding from her known hemorrhoids over the last year, she denies pain burning or itching, however has significant pain with bowel activity.  She reports avoiding bowel movements, and moves her bowels every other day.  She is taking stool softeners and fiber in the past but has not been successful at controlling/improving her constipation.  She is tried no topical medications at all.  She has no family history of colon cancer, has never had a colonoscopy.  Past Medical History Past Medical History:  Diagnosis Date   Patient denies medical problems    Sinus congestion       Past Surgical History:  Procedure Laterality Date   ANKLE SURGERY     LAPAROSCOPIC VAGINAL HYSTERECTOMY WITH SALPINGECTOMY Bilateral 06/25/2020   Procedure: LAPAROSCOPIC ASSISTED VAGINAL HYSTERECTOMY WITH BILATERAL SALPINGECTOMY;  Surgeon: Schermerhorn, Ihor Austin, MD;  Location: [facility] OR[facility];  Service: Gynecology;  Laterality: Bilateral;    No Known Allergies  Current Outpatient Medications  Medication Sig Dispense Refill   docusate sodium (COLACE) 100 MG capsule Take 100 mg by mouth in the morning and at bedtime.     No current facility-administered medications for this visit.    Family History No family history on file.    Social History Social History   Tobacco Use   Smoking status: Every Day   Smokeless tobacco: Never  Vaping Use   Vaping Use: Never used  Substance Use Topics   Alcohol use: Yes    Comment: occa   Drug use: Yes    Types: Marijuana        Review of Systems  Constitutional: Negative.   HENT: Negative.    Eyes: Negative.   Respiratory: Negative.    Cardiovascular: Negative.   Gastrointestinal:  Positive for abdominal pain, blood in stool and  constipation.  Genitourinary: Negative.   Skin: Negative.   Neurological: Negative.   Psychiatric/Behavioral: Negative.       Physical Exam Last menstrual period 05/28/2020.    CONSTITUTIONAL: Well developed, and nourished, appropriately responsive and aware without distress.   EYES: Sclera non-icteric.   EARS, NOSE, MOUTH AND THROAT:  The oropharynx is clear. Oral mucosa is pink and moist.     Hearing is intact to voice.  NECK: Trachea is midline, and there is no jugular venous distension.  LYMPH NODES:  Lymph nodes in the neck are not appreciated. RESPIRATORY:  Lungs are clear, and breath sounds are equal bilaterally.  Normal respiratory effort without pathologic use of accessory muscles. CARDIOVASCULAR: Heart is regular in rate and rhythm.   Well perfused.  GI: The abdomen is  soft, nontender, and nondistended. There were no palpable masses.  GU: *** DRE: [name] present as chaperone:  She has a prolapsing left sided internal hemorrhoid, and a chronic anterior fissure, with a mild rectocele.  There are grade 2 hemorrhoids on the right as well.  MUSCULOSKELETAL:  Symmetrical muscle tone appreciated in all four extremities.    SKIN: Skin turgor is normal. No pathologic skin lesions appreciated.  NEUROLOGIC:  Motor and sensation appear grossly normal.  Cranial nerves are grossly without defect. PSYCH:  Alert and oriented to person, place and time. Affect is appropriate for situation.  Data Reviewed I have personally reviewed what is currently available of  the patient's imaging, recent labs and medical records.   Labs:     Latest Ref Rng & Units 06/23/2020   11:29 AM 03/08/2020    1:31 PM  CBC  WBC 4.0 - 10.5 K/uL 9.1  7.4   Hemoglobin 12.0 - 15.0 g/dL 16.1  09.6   Hematocrit 36.0 - 46.0 % 39.5  38.9   Platelets 150 - 400 K/uL 197  181       Latest Ref Rng & Units 06/23/2020   11:29 AM 03/08/2020    1:31 PM 05/19/2016    6:43 PM  CMP  Glucose 70 - 99 mg/dL 045  82  409   BUN 6  - 20 mg/dL 10  9  9    Creatinine 0.44 - 1.00 mg/dL 8.11  9.14  7.82   Sodium 135 - 145 mmol/L 137  136  135   Potassium 3.5 - 5.1 mmol/L 3.8  4.0  3.7   Chloride 98 - 111 mmol/L 105  103  105   CO2 22 - 32 mmol/L 23  25  26    Calcium 8.9 - 10.3 mg/dL 9.6  9.3  9.2   Total Protein 6.5 - 8.1 g/dL  7.8  7.1   Total Bilirubin 0.3 - 1.2 mg/dL  0.6  0.3   Alkaline Phos 38 - 126 U/L  51  48   AST 15 - 41 U/L  18  19   ALT 0 - 44 U/L  19  18       Imaging: Radiological images reviewed:   Within last 24 hrs: No results found.  Assessment    Grade 3 hemorrhoids, with chronic anal fissure, constipation poorly controlled, currently with inadequate medical mgmt.  Patient Active Problem List   Diagnosis Date Noted   Prolapsed internal hemorrhoids, grade 3 02/23/2023   Constipation 02/23/2023   Anal fissure 02/23/2023     Plan    Will start  Advised to pursue a goal of 25 to 30 g of fiber daily.  Made aware that the majority of this may be through natural sources, but advised to be aware of actual consumption and to ensure minimal consumption by daily supplementation.  Various forms of supplements discussed.  Recommended Psyllium husk, that mixes well with applesauce, or the powder which goes down well shaken with chocolate milk.  Strongly advised to consume more fluids to ensure adequate hydration, instructed to watch color of urine to determine adequacy of hydration.  Clarity is pursued in urine output, and bowel activity that correlates to significant meal intake.   We need to avoid deferring having bowel movements, advised to take the time at the first sign of sensation, typically following meals, and in the morning.   Subsequent utilization of MiraLAX may be needed ensure at least daily movement, ideally twice daily bowel movements.  If multiple doses of MiraLAX are necessary utilize them. Never skip a day...  To be regular, we must do the above EVERY day.   Topically for the fissure,  Nifidipine and for the hemorrhoids Recticare Advanced.   F/u in one month or PRN.    Face-to-face time spent with the patient and accompanying care providers(if present) was 35 minutes, with more than 50% of the time spent counseling, educating, and coordinating care of the patient.    These notes generated with voice recognition software. I apologize for typographical errors.  Campbell Lerner M.D., FACS 03/28/2023, 1:21 PM

## 2023-03-29 ENCOUNTER — Ambulatory Visit: Payer: Commercial Managed Care - PPO | Admitting: Surgery

## 2023-05-16 DIAGNOSIS — U071 COVID-19: Secondary | ICD-10-CM | POA: Insufficient documentation

## 2023-07-24 DIAGNOSIS — E669 Obesity, unspecified: Secondary | ICD-10-CM | POA: Insufficient documentation

## 2023-07-25 ENCOUNTER — Other Ambulatory Visit: Payer: Self-pay | Admitting: Nurse Practitioner

## 2023-07-25 DIAGNOSIS — Z1231 Encounter for screening mammogram for malignant neoplasm of breast: Secondary | ICD-10-CM

## 2023-09-20 ENCOUNTER — Ambulatory Visit: Payer: Commercial Managed Care - PPO | Admitting: Surgery

## 2023-09-20 ENCOUNTER — Ambulatory Visit: Payer: Self-pay | Admitting: Surgery

## 2023-09-20 ENCOUNTER — Encounter: Payer: Self-pay | Admitting: Surgery

## 2023-09-20 VITALS — BP 119/78 | HR 80 | Temp 98.4°F | Ht 64.0 in | Wt 188.2 lb

## 2023-09-20 DIAGNOSIS — K642 Third degree hemorrhoids: Secondary | ICD-10-CM

## 2023-09-20 NOTE — Progress Notes (Signed)
Patient ID: Teresa Mclean, female   DOB: 11/13/1980, 42 y.o.   MRN: [id number]  Chief Complaint: Hemorrhoids for >1 year.  History of Present Illness   Teresa Mclean is a 42 y.o. female presents in follow-up for her hemorrhoids.  She reports her pain is a 1-6, unfortunately, not limited to bowel activity. She had a ED visit, late October, following about a 2-week episode of obstipation/constipation.  She got better with some aggressive oral laxatives, sounds like magnesium citrate or more aggressive MiraLAX.  She reports she is taking fiber Gummies, consistently, and along with MiraLAX she is able to now have bowel movements daily since early November.  She does notice some blood with wiping.  She denies pain burning or itching, however has increased pain with bowel activity.  She has tried topical nifedipine in the past to no avail at all.  She has no family history of colon cancer, has never had a colonoscopy.  She denies ever having any sufficient prolapse to require reduction.  Past Medical History Past Medical History:  Diagnosis Date   Patient denies medical problems    Sinus congestion       Past Surgical History:  Procedure Laterality Date   ANKLE SURGERY     LAPAROSCOPIC VAGINAL HYSTERECTOMY WITH SALPINGECTOMY Bilateral 06/25/2020   Procedure: LAPAROSCOPIC ASSISTED VAGINAL HYSTERECTOMY WITH BILATERAL SALPINGECTOMY;  Surgeon: Schermerhorn, Ihor Austin, MD;  Location: [facility] ORS;  Service: Gynecology;  Laterality: Bilateral;    No Known Allergies  No current outpatient medications on file.   No current facility-administered medications for this visit.    Family History History reviewed. No pertinent family history.    Social History Social History   Tobacco Use   Smoking status: Every Day    Passive exposure: Past   Smokeless tobacco: Never  Vaping Use   Vaping status: Never Used  Substance Use Topics   Alcohol use: Yes    Comment: occa   Drug use: Yes    Types:  Marijuana        Review of Systems  Constitutional: Negative.   HENT: Negative.    Eyes: Negative.   Respiratory: Negative.    Cardiovascular: Negative.   Gastrointestinal:  Positive for abdominal pain, blood in stool and constipation.  Genitourinary: Negative.   Skin: Negative.   Neurological: Negative.   Psychiatric/Behavioral: Negative.       Physical Exam Blood pressure 119/78, pulse 80, temperature 98.4 F (36.9 C), temperature source Oral, height 5\' 4"  (1.626 m), weight 188 lb 3.2 oz (85.4 kg), last menstrual period 05/28/2020, SpO2 97%. Last Weight  Most recent update: 09/20/2023 10:31 AM    Weight  85.4 kg (188 lb 3.2 oz)              CONSTITUTIONAL: Well developed, and nourished, appropriately responsive and aware without distress.   EYES: Sclera non-icteric.   EARS, NOSE, MOUTH AND THROAT:  The oropharynx is clear. Oral mucosa is pink and moist.     Hearing is intact to voice.  NECK: Trachea is midline, and there is no jugular venous distension.  LYMPH NODES:  Lymph nodes in the neck are not appreciated. RESPIRATORY:  Lungs are clear, and breath sounds are equal bilaterally.  Normal respiratory effort without pathologic use of accessory muscles. CARDIOVASCULAR: Heart is regular in rate and rhythm.   Well perfused.  GI: The abdomen is  soft, nontender, and nondistended. There were no palpable masses.  GU: DRE: [name] present as chaperone:  She  has a prolapsing left sided internal hemorrhoid, and a chronic anterior fissure scar, with a mild rectocele.  There are grade 2 hemorrhoids on the right as well.  MUSCULOSKELETAL:  Symmetrical muscle tone appreciated in all four extremities.    SKIN: Skin turgor is normal. No pathologic skin lesions appreciated.  NEUROLOGIC:  Motor and sensation appear grossly normal.  Cranial nerves are grossly without defect. PSYCH:  Alert and oriented to person, place and time. Affect is appropriate for situation.  Data Reviewed I have  personally reviewed what is currently available of the patient's imaging, recent labs and medical records.   Labs:     Latest Ref Rng & Units 06/23/2020   11:29 AM 03/08/2020    1:31 PM  CBC  WBC 4.0 - 10.5 K/uL 9.1  7.4   Hemoglobin 12.0 - 15.0 g/dL 40.3  47.4   Hematocrit 36.0 - 46.0 % 39.5  38.9   Platelets 150 - 400 K/uL 197  181       Latest Ref Rng & Units 06/23/2020   11:29 AM 03/08/2020    1:31 PM 05/19/2016    6:43 PM  CMP  Glucose 70 - 99 mg/dL 259  82  563   BUN 6 - 20 mg/dL 10  9  9    Creatinine 0.44 - 1.00 mg/dL 8.75  6.43  3.29   Sodium 135 - 145 mmol/L 137  136  135   Potassium 3.5 - 5.1 mmol/L 3.8  4.0  3.7   Chloride 98 - 111 mmol/L 105  103  105   CO2 22 - 32 mmol/L 23  25  26    Calcium 8.9 - 10.3 mg/dL 9.6  9.3  9.2   Total Protein 6.5 - 8.1 g/dL  7.8  7.1   Total Bilirubin 0.3 - 1.2 mg/dL  0.6  0.3   Alkaline Phos 38 - 126 U/L  51  48   AST 15 - 41 U/L  18  19   ALT 0 - 44 U/L  19  18       Imaging: Radiological images reviewed:   Within last 24 hrs: No results found.  Assessment    Grade 3 hemorrhoids, with chronic anal fissure, constipation better controlled.  Patient Active Problem List   Diagnosis Date Noted   Prolapsed internal hemorrhoids, grade 3 02/23/2023   Constipation 02/23/2023   Anal fissure 02/23/2023    Plan    Will proceed with internal hemorrhoidectomy, 2+ columns.  Risks and benefits of the procedure were discussed in detail these include but are not limited to anesthesia, bleeding, recurrence, anal sphincteric stenosis or dysfunction, potential for fecal soilage.  I believe she had her questions adequately answered, desires to proceed.  She will be expected to give herself an enema preop.  Will continue: Advised to pursue a goal of 25 to 30 g of fiber daily.  Made aware that the majority of this may be through natural sources, but advised to be aware of actual consumption and to ensure minimal consumption by daily  supplementation.  Various forms of supplements discussed.  Recommended Psyllium husk, that mixes well with applesauce, or the powder which goes down well shaken with chocolate milk.  Strongly advised to consume more fluids to ensure adequate hydration, instructed to watch color of urine to determine adequacy of hydration.  Clarity is pursued in urine output, and bowel activity that correlates to significant meal intake.   We need to avoid deferring having bowel movements, advised  to take the time at the first sign of sensation, typically following meals, and in the morning.   Subsequent utilization of MiraLAX may be needed ensure at least daily movement, ideally twice daily bowel movements.  If multiple doses of MiraLAX are necessary utilize them. Never skip a day...  To be regular, we must do the above EVERY day.    Face-to-face time spent with the patient and accompanying care providers(if present) was 45 minutes, with more than 50% of the time spent counseling, educating, and coordinating care of the patient.    These notes generated with voice recognition software. I apologize for typographical errors.  Campbell Lerner M.D., FACS 09/20/2023, 1:51 PM

## 2023-09-20 NOTE — Patient Instructions (Addendum)
You have requested to have Hemorrhoid surgery today. This will be scheduled at [facility] with Dr Claudine Mouton.  Please review the information below and your [facility] Information. Our surgery scheduler will call you to review surgery date and to go over information.  If you have FMLA or disability paperwork that needs filled out you may drop this off at our office or this can be faxed to [phone].  You will be required to do 2 enemas prior to your surgery. The first will be the night prior and the second will be done the morning of surgery.  Constipation is going to be your biggest obstacle following surgery. Use all stool softeners and laxatives as prescribed after your surgery and be sure to drink 72 ounces of water or more every day. This will help to avoid constipation. If you do all of this and you are still having difficulty, please call our office for further instructions as soon as you begin to have difficulty with bowel movements.  You may want to buy a disposable Sitz bath prior to surgery to aid in pain and cleanliness after surgery. The information on how to do use a disposable sitz bath or using a bath tub are below.  You will be out of work 1-2 weeks depending on how your healing goes. If you have FMLA/Disability paperwork that needs filled out you may drop this off at the office or fax it to [phone].   Hemorrhoid Surgery After Care Refer to this sheet in the next few weeks. These instructions provide you with information about caring for yourself after your procedure. Your health care provider may also give you more specific instructions. Your treatment has been planned according to current medical practices, but problems sometimes occur. Call your health care provider if you have any problems or questions after your procedure. What can I expect after the procedure? After the procedure, it is common to have: Rectal pain. Pain when you are having a bowel movement. Slight  rectal bleeding.  Follow these instructions at home: Medicines Take over-the-counter and prescription medicines only as told by your health care provider. Do not drive or operate heavy machinery while taking prescription pain medicine. Use a stool softener or a bulk laxative as told by your health care provider. Activity Rest at home. Return to your normal activities as told by your health care provider. Do not lift anything that is heavier than 10 lb (4.5 kg). Do not sit for long periods of time. Take a walk every day or as told by your health care provider. Do not strain to have a bowel movement. Do not spend a long time sitting on the toilet. Eating and drinking Eat foods that contain fiber, such as whole grains, beans, nuts, fruits, and vegetables. Drink enough fluid to keep your urine clear or pale yellow. General instructions Sit in a warm bath 2-3 times per day to relieve soreness or itching. Keep all follow-up visits as told by your health care provider. This is important. Contact a health care provider if: Your pain medicine is not helping. You have a fever or chills. You become constipated. You have trouble passing urine. Get help right away if: You have very bad rectal pain. You have heavy bleeding from your rectum. This information is not intended to replace advice given to you by your health care provider. Make sure you discuss any questions you have with your health care provider. Document Released: 12/16/2003 Document Revised: 03/02/2016 Document Reviewed: 12/21/2014 Elsevier Interactive  Patient Education  2018 Elsevier Inc.   Disposable Sitz Bath A disposable sitz bath is a plastic basin that fits over the toilet. A bag is hung above the toilet, and the bag is connected to a tube that opens into the basin. The bag is filled with warm water that flows into the basin through the tube. A sitz bath can be used to help relieve symptoms, clean, and promote healing in the  genital and anal areas, as well as in the lower abdomen and buttocks. What are the risks? Sitz baths are generally very safe. It is possible for the skin between the genitals and the anus (perineum) to become infected, but this is rare. You can avoid this by cleaning your sitz bath supplies thoroughly. How to use a disposable sitz bath Close the clamp on the tube. Make sure the clamp is closed tightly to prevent leakage. Fill the sitz bath basin and the plastic bag with warm water. The water should be warm enough to be comfortable, but not hot. Raise the toilet seat and place the filled basin on the toilet. Make sure the overflow opening is facing toward the back of the toilet. If you prefer, you may place the empty basin on the toilet first, and then use the plastic bag to fill the basin with warm water. Hang the filled plastic bag overhead on a hook or towel rack close to the toilet. The bag should be higher than the toilet so that the water will flow down through the tube. Attach the tube to the opening on the basin. Make sure that the tube is attached to the basin tightly to prevent leakage. Sit on the basin and release the clamp. This will allow warm water to flow into the basin and flush the area around your genitals and anus. Remain sitting on the basin for about 15-20 minutes, or as long as told by your health care provider. Stand up and gently pat your skin dry. If directed, apply clean bandages (dressings) to the affected area as told by your health care provider. Carefully remove the basin from the toilet seat and tip the basin into the toilet to empty any remaining water. Empty any remaining water from the plastic bag into the toilet. Then, flush the toilet. Wash the basin with warm water and soap. Let the basin air dry in the sink. You should also let the plastic bag and the tubing air dry. Store the basin, tubing, and plastic bag in a clean, dry area. Wash your hands with soap and  water. If soap and water are not available, use hand sanitizer. Contact a health care provider if: You have symptoms that get worse instead of better. You develop new skin irritation, redness, or swelling around your genitals or anus. This information is not intended to replace advice given to you by your health care provider. Make sure you discuss any questions you have with your health care provider. Document Released: 03/26/2012 Document Revised: 03/02/2016 Document Reviewed: 08/15/2015 Elsevier Interactive Patient Education  2018 [employer].   How to Take a [employer] A sitz bath is a warm water bath that is taken while you are sitting down. The water should only come up to your hips and should cover your buttocks. Your health care provider may recommend a sitz bath to help you: Clean the lower part of your body, including your genital area. With itching. With pain. With sore muscles or muscles that tighten or spasm.  How to  take a sitz bath Take 3-4 sitz baths per day or as told by your health care provider. Partially fill a bathtub with warm water. You will only need the water to be deep enough to cover your hips and buttocks when you are sitting in it. If your health care provider told you to put medicine in the water, follow the directions exactly. Sit in the water and open the tub drain a little. Turn on the warm water again to keep the tub at the correct level. Keep the water running constantly. Soak in the water for 15-20 minutes or as told by your health care provider. After the sitz bath, pat the affected area dry first. Do not rub it. Be careful when you stand up after the sitz bath because you may feel dizzy.  Contact a health care provider if: Your symptoms get worse. Do not continue with sitz baths if your symptoms get worse. You have new symptoms. Do not continue with sitz baths until you talk with your health care provider. This information is not intended to replace  advice given to you by your health care provider. Make sure you discuss any questions you have with your health care provider. Document Released: 06/17/2004 Document Revised: 02/23/2016 Document Reviewed: 09/23/2014 Elsevier Interactive Patient Education  [employer].

## 2023-09-21 ENCOUNTER — Telehealth: Payer: Self-pay | Admitting: Surgery

## 2023-09-21 NOTE — Telephone Encounter (Signed)
Patient has been advised of Pre-Admission date/time, and Surgery date at [facility].  Blue information sheet regarding surgery also mailed.    Surgery Date: 10/31/23 Preadmission Testing Date: 10/24/23 (phone 8a-1p)  Patient has been made aware to call [phone], between 1-3:00pm the day before surgery, to find out what time to arrive for surgery.

## 2023-10-24 ENCOUNTER — Telehealth: Payer: Self-pay | Admitting: *Deleted

## 2023-10-24 ENCOUNTER — Encounter
Admission: RE | Admit: 2023-10-24 | Discharge: 2023-10-24 | Disposition: A | Discharge status: Home / Self Care | Payer: Commercial Managed Care - PPO | Source: Ambulatory Visit | Attending: Surgery | Admitting: Surgery

## 2023-10-24 HISTORY — DX: Tobacco use: Z72.0

## 2023-10-24 HISTORY — DX: Third degree hemorrhoids: K64.2

## 2023-10-24 HISTORY — DX: Anemia, unspecified: D64.9

## 2023-10-24 HISTORY — DX: Cannabis use, unspecified, uncomplicated: F12.90

## 2023-10-24 HISTORY — DX: Constipation, unspecified: K59.00

## 2023-10-24 HISTORY — DX: Anal fissure, unspecified: K60.2

## 2023-10-24 HISTORY — DX: Bronchitis, not specified as acute or chronic: J40

## 2023-10-24 NOTE — Patient Instructions (Signed)
 Your procedure is scheduled on:10-31-23 Wednesday Report to the Registration Desk on the 1st floor of the [facility].Then proceed to the 2nd floor Surgery Desk To find out your arrival time, please call [phone] between 1PM - 3PM on:10-30-23 Tuesday If your arrival time is 6:00 am, do not arrive before that time as the Medical Mall entrance doors do not open until 6:00 am.  REMEMBER: Instructions that are not followed completely may result in serious medical risk, up to and including death; or upon the discretion of your surgeon and anesthesiologist your surgery may need to be rescheduled.  Do not eat food OR drink any liquids after midnight the night before surgery.  No gum chewing or hard candies.  One week prior to surgery:Stop NOW (10-24-23) Stop Anti-inflammatories (NSAIDS) such as Advil , Aleve, Ibuprofen , Motrin , Naproxen, Naprosyn and Aspirin based products such as Excedrin, Goody's Powder, BC Powder. Stop ANY OVER THE COUNTER supplements until after surgery.  You may however, continue to take Tylenol  if needed for pain up until the day of surgery.  Continue taking all of your other prescription medications up until the day of surgery.  Do NOT take any medication the day of surgery  Do Fleet Enema as directed: Do 1 Fleet Enema the night prior to surgery and do another Fleet Enema the morning of surgery 1 hour prior to arrival time to the hospital (total of 2 enemas)  No Alcohol for 24 hours before or after surgery.  No Smoking including e-cigarettes for 24 hours before surgery.  No chewable tobacco products for at least 6 hours before surgery.  No nicotine patches on the day of surgery.  Do not use any "recreational" drugs for at least a week (preferably 2 weeks) before your surgery.  Please be advised that the combination of cocaine and anesthesia may have negative outcomes, up to and including death. If you test positive for cocaine, your surgery will be cancelled.  On  the morning of surgery brush your teeth with toothpaste and water, you may rinse your mouth with mouthwash if you wish. Do not swallow any toothpaste or mouthwash.  Do not wear jewelry, make-up, hairpins, clips or nail polish.  For welded (permanent) jewelry: bracelets, anklets, waist bands, etc.  Please have this removed prior to surgery.  If it is not removed, there is a chance that hospital personnel will need to cut it off on the day of surgery.  Do not wear lotions, powders, or perfumes.   Do not shave body hair from the neck down 48 hours before surgery.  Contact lenses, hearing aids and dentures may not be worn into surgery.  Do not bring valuables to the hospital. [facility] is not responsible for any missing/lost belongings or valuables.   Notify your doctor if there is any change in your medical condition (cold, fever, infection).  Wear comfortable clothing (specific to your surgery type) to the hospital.  After surgery, you can help prevent lung complications by doing breathing exercises.  Take deep breaths and cough every 1-2 hours. Your doctor may order a device called an Incentive Spirometer to help you take deep breaths. When coughing or sneezing, hold a pillow firmly against your incision with both hands. This is called "splinting." Doing this helps protect your incision. It also decreases belly discomfort.  If you are being admitted to the hospital overnight, leave your suitcase in the car. After surgery it may be brought to your room.  In case of increased patient census,  it may be necessary for you, the patient, to continue your postoperative care in the Same Day Surgery department.  If you are being discharged the day of surgery, you will not be allowed to drive home. You will need a responsible individual to drive you home and stay with you for 24 hours after surgery.   If you are taking public transportation, you will need to have a responsible individual with  you.  Please call the Pre-admissions Testing Dept. at [phone] if you have any questions about these instructions.  Surgery Visitation Policy:  Patients having surgery or a procedure may have two visitors.  Children under the age of 55 must have an adult with them who is not the patient.  Temporary Visitor Restrictions Due to increasing cases of flu, RSV and COVID-19: Children ages 72 and under will not be able to visit patients in [facility] hospitals under most circumstances.

## 2023-10-24 NOTE — Telephone Encounter (Signed)
 Faxed FMLA to [employer] [phone]

## 2023-10-29 ENCOUNTER — Encounter
Admission: RE | Admit: 2023-10-29 | Discharge: 2023-10-29 | Disposition: A | Discharge status: Home / Self Care | Payer: Commercial Managed Care - PPO | Source: Ambulatory Visit | Attending: Surgery | Admitting: Surgery

## 2023-10-29 DIAGNOSIS — K642 Third degree hemorrhoids: Secondary | ICD-10-CM | POA: Diagnosis not present

## 2023-10-29 DIAGNOSIS — Z01812 Encounter for preprocedural laboratory examination: Secondary | ICD-10-CM | POA: Insufficient documentation

## 2023-10-29 LAB — CBC WITH DIFFERENTIAL/PLATELET
Abs Immature Granulocytes: 0.03 10*3/uL (ref 0.00–0.07)
Basophils Absolute: 0 10*3/uL (ref 0.0–0.1)
Basophils Relative: 0 %
Eosinophils Absolute: 0 10*3/uL (ref 0.0–0.5)
Eosinophils Relative: 0 %
HCT: 39.9 % (ref 36.0–46.0)
Hemoglobin: 13.3 g/dL (ref 12.0–15.0)
Immature Granulocytes: 0 %
Lymphocytes Relative: 30 %
Lymphs Abs: 2.9 10*3/uL (ref 0.7–4.0)
MCH: 32.5 pg (ref 26.0–34.0)
MCHC: 33.3 g/dL (ref 30.0–36.0)
MCV: 97.6 fL (ref 80.0–100.0)
Monocytes Absolute: 0.8 10*3/uL (ref 0.1–1.0)
Monocytes Relative: 8 %
Neutro Abs: 5.8 10*3/uL (ref 1.7–7.7)
Neutrophils Relative %: 62 %
Platelets: 201 10*3/uL (ref 150–400)
RBC: 4.09 MIL/uL (ref 3.87–5.11)
RDW: 12.7 % (ref 11.5–15.5)
WBC: 9.5 10*3/uL (ref 4.0–10.5)
nRBC: 0 % (ref 0.0–0.2)

## 2023-10-29 LAB — COMPREHENSIVE METABOLIC PANEL
ALT: 16 U/L (ref 0–44)
AST: 19 U/L (ref 15–41)
Albumin: 3.8 g/dL (ref 3.5–5.0)
Alkaline Phosphatase: 41 U/L (ref 38–126)
Anion gap: 7 (ref 5–15)
BUN: 13 mg/dL (ref 6–20)
CO2: 25 mmol/L (ref 22–32)
Calcium: 9.1 mg/dL (ref 8.9–10.3)
Chloride: 105 mmol/L (ref 98–111)
Creatinine, Ser: 0.83 mg/dL (ref 0.44–1.00)
GFR, Estimated: >60 mL/min (ref >60)
Glucose, Bld: 84 mg/dL (ref 70–99)
Potassium: 3.8 mmol/L (ref 3.5–5.1)
Sodium: 137 mmol/L (ref 135–145)
Total Bilirubin: 0.6 mg/dL (ref 0.0–1.2)
Total Protein: 7.1 g/dL (ref 6.5–8.1)

## 2023-10-31 ENCOUNTER — Other Ambulatory Visit: Payer: Self-pay

## 2023-10-31 ENCOUNTER — Encounter
Admission: RE | Disposition: A | Discharge status: Home / Self Care | Payer: Self-pay | Source: Home / Self Care | Attending: Surgery

## 2023-10-31 ENCOUNTER — Ambulatory Visit
Admission: RE | Admit: 2023-10-31 | Discharge: 2023-10-31 | Disposition: A | Discharge status: Home / Self Care | Payer: Commercial Managed Care - PPO | Attending: Surgery | Admitting: Surgery

## 2023-10-31 ENCOUNTER — Ambulatory Visit: Payer: Commercial Managed Care - PPO | Admitting: Urgent Care

## 2023-10-31 ENCOUNTER — Encounter: Payer: Self-pay | Admitting: Surgery

## 2023-10-31 ENCOUNTER — Ambulatory Visit: Payer: Commercial Managed Care - PPO

## 2023-10-31 DIAGNOSIS — K642 Third degree hemorrhoids: Secondary | ICD-10-CM | POA: Diagnosis present

## 2023-10-31 DIAGNOSIS — Z5309 Procedure and treatment not carried out because of other contraindication: Secondary | ICD-10-CM | POA: Diagnosis not present

## 2023-10-31 DIAGNOSIS — K5900 Constipation, unspecified: Secondary | ICD-10-CM | POA: Diagnosis not present

## 2023-10-31 DIAGNOSIS — K601 Chronic anal fissure: Secondary | ICD-10-CM | POA: Diagnosis not present

## 2023-10-31 SURGERY — HEMORRHOIDECTOMY
Anesthesia: General

## 2023-10-31 MED ORDER — CHLORHEXIDINE GLUCONATE 0.12 % MT SOLN
15.0000 mL | Freq: Once | OROMUCOSAL | Status: AC
  Administered 2023-10-31: 15 mL via OROMUCOSAL

## 2023-10-31 MED ORDER — FLEET ENEMA RE ENEM
1.0000 | ENEMA | Freq: Once | RECTAL | Status: AC
  Administered 2023-10-31: 1 via RECTAL

## 2023-10-31 MED ORDER — FENTANYL CITRATE (PF) 100 MCG/2ML IJ SOLN
INTRAMUSCULAR | Status: AC
  Filled 2023-10-31: qty 2

## 2023-10-31 MED ORDER — CELECOXIB 200 MG PO CAPS
ORAL_CAPSULE | ORAL | Status: AC
  Filled 2023-10-31: qty 1

## 2023-10-31 MED ORDER — GABAPENTIN 300 MG PO CAPS
300.0000 mg | ORAL_CAPSULE | ORAL | Status: AC
  Administered 2023-10-31: 300 mg via ORAL

## 2023-10-31 MED ORDER — PROPOFOL 1000 MG/100ML IV EMUL
INTRAVENOUS | Status: AC
  Filled 2023-10-31: qty 100

## 2023-10-31 MED ORDER — ACETAMINOPHEN 500 MG PO TABS
ORAL_TABLET | ORAL | Status: AC
  Filled 2023-10-31: qty 2

## 2023-10-31 MED ORDER — ONDANSETRON HCL 4 MG/2ML IJ SOLN
INTRAMUSCULAR | Status: AC
  Filled 2023-10-31: qty 2

## 2023-10-31 MED ORDER — BUPIVACAINE LIPOSOME 1.3 % IJ SUSP: 20.0000 mL | Freq: Once | INTRAMUSCULAR | Status: DC

## 2023-10-31 MED ORDER — PHENYLEPHRINE 80 MCG/ML (10ML) SYRINGE FOR IV PUSH (FOR BLOOD PRESSURE SUPPORT)
PREFILLED_SYRINGE | INTRAVENOUS | Status: AC
  Filled 2023-10-31: qty 10

## 2023-10-31 MED ORDER — CELECOXIB 200 MG PO CAPS
200.0000 mg | ORAL_CAPSULE | ORAL | Status: AC
  Administered 2023-10-31: 200 mg via ORAL

## 2023-10-31 MED ORDER — SUCCINYLCHOLINE CHLORIDE 200 MG/10ML IV SOSY
PREFILLED_SYRINGE | INTRAVENOUS | Status: AC
  Filled 2023-10-31: qty 10

## 2023-10-31 MED ORDER — LIDOCAINE HCL (PF) 2 % IJ SOLN
INTRAMUSCULAR | Status: AC
  Filled 2023-10-31: qty 5

## 2023-10-31 MED ORDER — ACETAMINOPHEN 500 MG PO TABS
1000.0000 mg | ORAL_TABLET | ORAL | Status: AC
  Administered 2023-10-31: 1000 mg via ORAL

## 2023-10-31 MED ORDER — ORAL CARE MOUTH RINSE: 15.0000 mL | Freq: Once | OROMUCOSAL | Status: AC

## 2023-10-31 MED ORDER — FLEET ENEMA RE ENEM
1.0000 | ENEMA | Freq: Once | RECTAL | Status: AC
  Administered 2023-10-30: 1 via RECTAL

## 2023-10-31 MED ORDER — DEXAMETHASONE SODIUM PHOSPHATE 10 MG/ML IJ SOLN
INTRAMUSCULAR | Status: AC
  Filled 2023-10-31: qty 1

## 2023-10-31 MED ORDER — KETOROLAC TROMETHAMINE 30 MG/ML IJ SOLN
INTRAMUSCULAR | Status: AC
  Filled 2023-10-31: qty 1

## 2023-10-31 MED ORDER — LACTATED RINGERS IV SOLN: INTRAVENOUS | Status: DC

## 2023-10-31 MED ORDER — CHLORHEXIDINE GLUCONATE CLOTH 2 % EX PADS
6.0000 | MEDICATED_PAD | Freq: Once | CUTANEOUS | Status: AC
  Administered 2023-10-31: 6 via TOPICAL

## 2023-10-31 MED ORDER — GABAPENTIN 300 MG PO CAPS
ORAL_CAPSULE | ORAL | Status: AC
  Filled 2023-10-31: qty 1

## 2023-10-31 MED ORDER — CHLORHEXIDINE GLUCONATE CLOTH 2 % EX PADS: 6.0000 | MEDICATED_PAD | Freq: Once | CUTANEOUS | Status: DC

## 2023-10-31 MED ORDER — MIDAZOLAM HCL 2 MG/2ML IJ SOLN
INTRAMUSCULAR | Status: AC
  Filled 2023-10-31: qty 2

## 2023-10-31 MED ORDER — CHLORHEXIDINE GLUCONATE 0.12 % MT SOLN
OROMUCOSAL | Status: AC
  Filled 2023-10-31: qty 15

## 2023-10-31 NOTE — Progress Notes (Signed)
Patient reports drinking 2-3 sips of coffee with creamer this morning at 0705. Dr. Pernell Dupre notified and states due to PO intake with creamer, surgery start time delayed until 1505 today. Dr. Claudine Mouton unable to perform surgery at this time. Surgery cancelled and patient instructed to call Dr. Benson Norway office to reschedule surgery. Once surgery is rescheduled patient instructed on no PO intake prior to surgery due to risk of aspiration. Patient voiced understanding.

## 2023-10-31 NOTE — H&P (Signed)
Chief Complaint: Hemorrhoids for >1 year.   History of Present Illness     Teresa Mclean is a 43 y.o. female presents in follow-up for her hemorrhoids.  She reports her pain is a 1-6, unfortunately, not limited to bowel activity. She had a ED visit, late October, following about a 2-week episode of obstipation/constipation.  She got better with some aggressive oral laxatives, sounds like magnesium citrate or more aggressive MiraLAX.  She reports she is taking fiber Gummies, consistently, and along with MiraLAX she is able to now have bowel movements daily since early November.  She does notice some blood with wiping.  She denies pain burning or itching, however has increased pain with bowel activity.  She has tried topical nifedipine in the past to no avail at all.  She has no family history of colon cancer, has never had a colonoscopy.  She denies ever having any sufficient prolapse to require reduction.   Past Medical History     Past Medical History:  Diagnosis Date   Patient denies medical problems     Sinus congestion                   Past Surgical History:  Procedure Laterality Date   ANKLE SURGERY       LAPAROSCOPIC VAGINAL HYSTERECTOMY WITH SALPINGECTOMY Bilateral 06/25/2020    Procedure: LAPAROSCOPIC ASSISTED VAGINAL HYSTERECTOMY WITH BILATERAL SALPINGECTOMY;  Surgeon: Schermerhorn, Ihor Austin, MD;  Location: [facility] OR[facility];  Service: Gynecology;  Laterality: Bilateral;          Allergies  No Known Allergies     No current outpatient medications on file.      No current facility-administered medications for this visit.        Family History History reviewed. No pertinent family history.          Social History Social History  Social History         Tobacco Use   Smoking status: Every Day      Passive exposure: Past   Smokeless tobacco: Never  Vaping Use   Vaping status: Never Used  Substance Use Topics   Alcohol use: Yes      Comment: occa   Drug use:  Yes      Types: Marijuana            Review of Systems  Constitutional: Negative.   HENT: Negative.    Eyes: Negative.   Respiratory: Negative.    Cardiovascular: Negative.   Gastrointestinal:  Positive for abdominal pain, blood in stool and constipation.  Genitourinary: Negative.   Skin: Negative.   Neurological: Negative.   Psychiatric/Behavioral: Negative.          Physical Exam Blood pressure 119/78, pulse 80, temperature 98.4 F (36.9 C), temperature source Oral, height 5\' 4"  (1.626 m), weight 188 lb 3.2 oz (85.4 kg), last menstrual period 05/28/2020, SpO2 97%. Last Weight  Most recent update: 09/20/2023 10:31 AM      Weight  85.4 kg (188 lb 3.2 oz)                       CONSTITUTIONAL: Well developed, and nourished, appropriately responsive and aware without distress.   EYES: Sclera non-icteric.   EARS, NOSE, MOUTH AND THROAT:  The oropharynx is clear. Oral mucosa is pink and moist.     Hearing is intact to voice.  NECK: Trachea is midline, and there is no jugular venous distension.  LYMPH NODES:  Lymph  nodes in the neck are not appreciated. RESPIRATORY:  Lungs are clear, and breath sounds are equal bilaterally.  Normal respiratory effort without pathologic use of accessory muscles. CARDIOVASCULAR: Heart is regular in rate and rhythm.   Well perfused.  GI: The abdomen is  soft, nontender, and nondistended. There were no palpable masses.  GU: DRE: [name] present as chaperone:  She has a prolapsing left sided internal hemorrhoid, and a chronic anterior fissure scar, with a mild rectocele.  There are grade 2 hemorrhoids on the right as well.  MUSCULOSKELETAL:  Symmetrical muscle tone appreciated in all four extremities.    SKIN: Skin turgor is normal. No pathologic skin lesions appreciated.  NEUROLOGIC:  Motor and sensation appear grossly normal.  Cranial nerves are grossly without defect. PSYCH:  Alert and oriented to person, place and time. Affect is appropriate for  situation.   Data Reviewed I have personally reviewed what is currently available of the patient's imaging, recent labs and medical records.   Labs:      Latest Ref Rng & Units 06/23/2020   11:29 AM 03/08/2020    1:31 PM  CBC  WBC 4.0 - 10.5 K/uL 9.1  7.4   Hemoglobin 12.0 - 15.0 g/dL 16.1  09.6   Hematocrit 36.0 - 46.0 % 39.5  38.9   Platelets 150 - 400 K/uL 197  181         Latest Ref Rng & Units 06/23/2020   11:29 AM 03/08/2020    1:31 PM 05/19/2016    6:43 PM  CMP  Glucose 70 - 99 mg/dL 045  82  409   BUN 6 - 20 mg/dL 10  9  9    Creatinine 0.44 - 1.00 mg/dL 8.11  9.14  7.82   Sodium 135 - 145 mmol/L 137  136  135   Potassium 3.5 - 5.1 mmol/L 3.8  4.0  3.7   Chloride 98 - 111 mmol/L 105  103  105   CO2 22 - 32 mmol/L 23  25  26    Calcium 8.9 - 10.3 mg/dL 9.6  9.3  9.2   Total Protein 6.5 - 8.1 g/dL   7.8  7.1   Total Bilirubin 0.3 - 1.2 mg/dL   0.6  0.3   Alkaline Phos 38 - 126 U/L   51  48   AST 15 - 41 U/L   18  19   ALT 0 - 44 U/L   19  18           Imaging: Radiological images reviewed:    Within last 24 hrs: No results found.   Assessment Assessment  Grade 3 hemorrhoids, with chronic anal fissure, constipation better controlled.      Patient Active Problem List    Diagnosis Date Noted   Prolapsed internal hemorrhoids, grade 3 02/23/2023   Constipation 02/23/2023   Anal fissure 02/23/2023      Plan Plan Will proceed with internal hemorrhoidectomy, 2+ columns.   Risks and benefits of the procedure were discussed in detail these include but are not limited to anesthesia, bleeding, recurrence, anal sphincteric stenosis or dysfunction, potential for fecal soilage.  I believe she had her questions adequately answered, desires to proceed.   She will be expected to give herself an enema preop.   Will continue: Advised to pursue a goal of 25 to 30 g of fiber daily.  Made aware that the majority of this may be through natural sources, but advised to be aware of  actual consumption and to ensure minimal consumption by daily supplementation.  Various forms of supplements discussed.  Recommended Psyllium husk, that mixes well with applesauce, or the powder which goes down well shaken with chocolate milk.  Strongly advised to consume more fluids to ensure adequate hydration, instructed to watch color of urine to determine adequacy of hydration.  Clarity is pursued in urine output, and bowel activity that correlates to significant meal intake.   We need to avoid deferring having bowel movements, advised to take the time at the first sign of sensation, typically following meals, and in the morning.   Subsequent utilization of MiraLAX may be needed ensure at least daily movement, ideally twice daily bowel movements.  If multiple doses of MiraLAX are necessary utilize them. Never skip a day...  To be regular, we must do the above EVERY day.      Face-to-face time spent with the patient and accompanying care providers(if present) was 45 minutes, with more than 50% of the time spent counseling, educating, and coordinating care of the patient.     These notes generated with voice recognition software. I apologize for typographical errors.

## 2023-10-31 NOTE — H&P (View-Only) (Signed)
 Chief Complaint: Hemorrhoids for >1 year.   History of Present Illness     Teresa Mclean is a 43 y.o. female presents in follow-up for her hemorrhoids.  She reports her pain is a 1-6, unfortunately, not limited to bowel activity. She had a ED visit, late October, following about a 2-week episode of obstipation/constipation.  She got better with some aggressive oral laxatives, sounds like magnesium citrate or more aggressive MiraLAX.  She reports she is taking fiber Gummies, consistently, and along with MiraLAX she is able to now have bowel movements daily since early November.  She does notice some blood with wiping.  She denies pain burning or itching, however has increased pain with bowel activity.  She has tried topical nifedipine in the past to no avail at all.  She has no family history of colon cancer, has never had a colonoscopy.  She denies ever having any sufficient prolapse to require reduction.   Past Medical History     Past Medical History:  Diagnosis Date   Patient denies medical problems     Sinus congestion                   Past Surgical History:  Procedure Laterality Date   ANKLE SURGERY       LAPAROSCOPIC VAGINAL HYSTERECTOMY WITH SALPINGECTOMY Bilateral 06/25/2020    Procedure: LAPAROSCOPIC ASSISTED VAGINAL HYSTERECTOMY WITH BILATERAL SALPINGECTOMY;  Surgeon: Schermerhorn, Ihor Austin, MD;  Location: [facility];  Service: Gynecology;  Laterality: Bilateral;          Allergies  No Known Allergies     No current outpatient medications on file.      No current facility-administered medications for this visit.        Family History History reviewed. No pertinent family history.          Social History Social History  Social History         Tobacco Use   Smoking status: Every Day      Passive exposure: Past   Smokeless tobacco: Never  Vaping Use   Vaping status: Never Used  Substance Use Topics   Alcohol use: Yes      Comment: occa   Drug use:  Yes      Types: Marijuana            Review of Systems  Constitutional: Negative.   HENT: Negative.    Eyes: Negative.   Respiratory: Negative.    Cardiovascular: Negative.   Gastrointestinal:  Positive for abdominal pain, blood in stool and constipation.  Genitourinary: Negative.   Skin: Negative.   Neurological: Negative.   Psychiatric/Behavioral: Negative.          Physical Exam Blood pressure 119/78, pulse 80, temperature 98.4 F (36.9 C), temperature source Oral, height 5\' 4"  (1.626 m), weight 188 lb 3.2 oz (85.4 kg), last menstrual period 05/28/2020, SpO2 97%. Last Weight  Most recent update: 09/20/2023 10:31 AM      Weight  85.4 kg (188 lb 3.2 oz)                       CONSTITUTIONAL: Well developed, and nourished, appropriately responsive and aware without distress.   EYES: Sclera non-icteric.   EARS, NOSE, MOUTH AND THROAT:  The oropharynx is clear. Oral mucosa is pink and moist.     Hearing is intact to voice.  NECK: Trachea is midline, and there is no jugular venous distension.  LYMPH NODES:  Lymph  nodes in the neck are not appreciated. RESPIRATORY:  Lungs are clear, and breath sounds are equal bilaterally.  Normal respiratory effort without pathologic use of accessory muscles. CARDIOVASCULAR: Heart is regular in rate and rhythm.   Well perfused.  GI: The abdomen is  soft, nontender, and nondistended. There were no palpable masses.  GU: DRE: [name] present as chaperone:  She has a prolapsing left sided internal hemorrhoid, and a chronic anterior fissure scar, with a mild rectocele.  There are grade 2 hemorrhoids on the right as well.  MUSCULOSKELETAL:  Symmetrical muscle tone appreciated in all four extremities.    SKIN: Skin turgor is normal. No pathologic skin lesions appreciated.  NEUROLOGIC:  Motor and sensation appear grossly normal.  Cranial nerves are grossly without defect. PSYCH:  Alert and oriented to person, place and time. Affect is appropriate for  situation.   Data Reviewed I have personally reviewed what is currently available of the patient's imaging, recent labs and medical records.   Labs:      Latest Ref Rng & Units 06/23/2020   11:29 AM 03/08/2020    1:31 PM  CBC  WBC 4.0 - 10.5 K/uL 9.1  7.4   Hemoglobin 12.0 - 15.0 g/dL 16.1  09.6   Hematocrit 36.0 - 46.0 % 39.5  38.9   Platelets 150 - 400 K/uL 197  181         Latest Ref Rng & Units 06/23/2020   11:29 AM 03/08/2020    1:31 PM 05/19/2016    6:43 PM  CMP  Glucose 70 - 99 mg/dL 045  82  409   BUN 6 - 20 mg/dL 10  9  9    Creatinine 0.44 - 1.00 mg/dL 8.11  9.14  7.82   Sodium 135 - 145 mmol/L 137  136  135   Potassium 3.5 - 5.1 mmol/L 3.8  4.0  3.7   Chloride 98 - 111 mmol/L 105  103  105   CO2 22 - 32 mmol/L 23  25  26    Calcium 8.9 - 10.3 mg/dL 9.6  9.3  9.2   Total Protein 6.5 - 8.1 g/dL   7.8  7.1   Total Bilirubin 0.3 - 1.2 mg/dL   0.6  0.3   Alkaline Phos 38 - 126 U/L   51  48   AST 15 - 41 U/L   18  19   ALT 0 - 44 U/L   19  18           Imaging: Radiological images reviewed:    Within last 24 hrs: No results found.   Assessment Assessment  Grade 3 hemorrhoids, with chronic anal fissure, constipation better controlled.      Patient Active Problem List    Diagnosis Date Noted   Prolapsed internal hemorrhoids, grade 3 02/23/2023   Constipation 02/23/2023   Anal fissure 02/23/2023      Plan Plan Will proceed with internal hemorrhoidectomy, 2+ columns.   Risks and benefits of the procedure were discussed in detail these include but are not limited to anesthesia, bleeding, recurrence, anal sphincteric stenosis or dysfunction, potential for fecal soilage.  I believe she had her questions adequately answered, desires to proceed.   She will be expected to give herself an enema preop.   Will continue: Advised to pursue a goal of 25 to 30 g of fiber daily.  Made aware that the majority of this may be through natural sources, but advised to be aware of  actual consumption and to ensure minimal consumption by daily supplementation.  Various forms of supplements discussed.  Recommended Psyllium husk, that mixes well with applesauce, or the powder which goes down well shaken with chocolate milk.  Strongly advised to consume more fluids to ensure adequate hydration, instructed to watch color of urine to determine adequacy of hydration.  Clarity is pursued in urine output, and bowel activity that correlates to significant meal intake.   We need to avoid deferring having bowel movements, advised to take the time at the first sign of sensation, typically following meals, and in the morning.   Subsequent utilization of MiraLAX may be needed ensure at least daily movement, ideally twice daily bowel movements.  If multiple doses of MiraLAX are necessary utilize them. Never skip a day...  To be regular, we must do the above EVERY day.      Face-to-face time spent with the patient and accompanying care providers(if present) was 45 minutes, with more than 50% of the time spent counseling, educating, and coordinating care of the patient.     These notes generated with voice recognition software. I apologize for typographical errors.

## 2023-11-01 ENCOUNTER — Telehealth: Payer: Self-pay | Admitting: Surgery

## 2023-11-01 NOTE — Telephone Encounter (Signed)
Patient has been advised of Pre-Admission date/time, and Surgery date at [facility].  Surgery Date: 11/09/23 Preadmission Testing Date: 11/07/23 (phone 8a-1p)  Patient has been made aware to call [phone], between 1-3:00pm the day before surgery, to find out what time to arrive for surgery.

## 2023-11-06 ENCOUNTER — Encounter: Payer: Self-pay | Admitting: Registered Nurse

## 2023-11-07 ENCOUNTER — Encounter
Admit: 2023-11-07 | Discharge: 2023-11-07 | Disposition: A | Discharge status: Home / Self Care | Payer: Commercial Managed Care - PPO | Attending: Surgery | Admitting: Surgery

## 2023-11-07 ENCOUNTER — Ambulatory Visit: Payer: Self-pay | Admitting: Surgery

## 2023-11-07 ENCOUNTER — Other Ambulatory Visit: Payer: Self-pay

## 2023-11-07 DIAGNOSIS — K642 Third degree hemorrhoids: Secondary | ICD-10-CM

## 2023-11-07 NOTE — Patient Instructions (Addendum)
Your procedure is scheduled on: Friday 11/09/23 Report to the Registration Desk on the 1st floor of the [facility]. To find out your arrival time, please call [phone] between 1PM - 3PM on: Thursday 11/08/23 If your arrival time is 6:00 am, do not arrive before that time as the [facility] entrance doors do not open until 6:00 am.  REMEMBER: Instructions that are not followed completely may result in serious medical risk, up to and including death; or upon the discretion of your surgeon and anesthesiologist your surgery may need to be rescheduled.  Do not eat food or drink fluids after midnight the night before surgery.  No gum chewing or hard candies.  One week prior to surgery: Stop Anti-inflammatories (NSAIDS) such as Advil, Aleve, Ibuprofen, Motrin, Naproxen, Naprosyn and Aspirin based products such as Excedrin, Goody's Powder, BC Powder. Stop ANY OVER THE COUNTER supplements until after surgery.  You may however, continue to take Tylenol if needed for pain up until the day of surgery.   Use Fleets Enemas as directed. 1- the night before your surgery and the 2nd -  the morning of your surgery 1 hour before your arrival time  at  hospital. Total of two enemas.   No Alcohol for 24 hours before or after surgery.  No Smoking including e-cigarettes for 24 hours before surgery.  No chewable tobacco products for at least 6 hours before surgery.  No nicotine patches on the day of surgery.  Do not use any "recreational" drugs for at least a week (preferably 2 weeks) before your surgery.  Please be advised that the combination of cocaine and anesthesia may have negative outcomes, up to and including death. If you test positive for cocaine, your surgery will be cancelled.  On the morning of surgery brush your teeth with toothpaste and water, you may rinse your mouth with mouthwash if you wish. Do not swallow any toothpaste or mouthwash.  Use Chlorhexidine Gluconate (CHG) Soap see  instructions below.   Do not wear jewelry, make-up, hairpins, clips or nail polish.  For welded (permanent) jewelry: bracelets, anklets, waist bands, etc.  Please have this removed prior to surgery.  If it is not removed, there is a chance that hospital personnel will need to cut it off on the day of surgery.  Do not wear lotions, powders, or perfumes.   Do not shave body hair from the neck down 48 hours before surgery.  Contact lenses, hearing aids and dentures may not be worn into surgery.  Do not bring valuables to the hospital. [facility] is not responsible for any missing/lost belongings or valuables.   Notify your doctor if there is any change in your medical condition (cold, fever, infection).  Wear comfortable clothing (specific to your surgery type) to the hospital.  After surgery, you can help prevent lung complications by doing breathing exercises.  Take deep breaths and cough every 1-2 hours. Your doctor may order a device called an Incentive Spirometer to help you take deep breaths. When coughing or sneezing, hold a pillow firmly against your incision with both hands. This is called "splinting." Doing this helps protect your incision. It also decreases belly discomfort.  If you are being admitted to the hospital overnight, leave your suitcase in the car. After surgery it may be brought to your room.  In case of increased patient census, it may be necessary for you, the patient, to continue your postoperative care in the Same Day Surgery department.  If you  are being discharged the day of surgery, you will not be allowed to drive home. You will need a responsible individual to drive you home and stay with you for 24 hours after surgery.   If you are taking public transportation, you will need to have a responsible individual with you.  Please call the Pre-admissions Testing Dept. at [phone] if you have any questions about these instructions.  Surgery Visitation  Policy:  Patients having surgery or a procedure may have two visitors.  Children under the age of 13 must have an adult with them who is not the patient.  Temporary Visitor Restrictions Due to increasing cases of flu, RSV and COVID-19: Children ages 63 and under will not be able to visit patients in [facility] hospitals under most circumstances.  Inpatient Visitation:    Visiting hours are 7 a.m. to 8 p.m. Up to four visitors are allowed at one time in a patient room. The visitors may rotate out with other people during the day.  One visitor age 77 or older may stay with the patient overnight and must be in the room by 8 p.m.    Preparing for Surgery with CHLORHEXIDINE GLUCONATE (CHG) Soap  Chlorhexidine Gluconate (CHG) Soap  o An antiseptic cleaner that kills germs and bonds with the skin to continue killing germs even after washing  o Used for showering the night before surgery and morning of surgery  Before surgery, you can play an important role by reducing the number of germs on your skin.  CHG (Chlorhexidine gluconate) soap is an antiseptic cleanser which kills germs and bonds with the skin to continue killing germs even after washing.  Please do not use if you have an allergy to CHG or antibacterial soaps. If your skin becomes reddened/irritated stop using the CHG.  1. Shower the NIGHT BEFORE SURGERY and the MORNING OF SURGERY with CHG soap.  2. If you choose to wash your hair, wash your hair first as usual with your normal shampoo.  3. After shampooing, rinse your hair and body thoroughly to remove the shampoo.  4. Use CHG as you would any other liquid soap. You can apply CHG directly to the skin and wash gently with a scrungie or a clean washcloth.  5. Apply the CHG soap to your body only from the neck down. Do not use on open wounds or open sores. Avoid contact with your eyes, ears, mouth, and genitals (private parts). Wash face and genitals (private parts) with your  normal soap.  6. Wash thoroughly, paying special attention to the area where your surgery will be performed.  7. Thoroughly rinse your body with warm water.  8. Do not shower/wash with your normal soap after using and rinsing off the CHG soap.  9. Pat yourself dry with a clean towel.  10. Wear clean pajamas to bed the night before surgery.  12. Place clean sheets on your bed the night of your first shower and do not sleep with pets.  13. Shower again with the CHG soap on the day of surgery prior to arriving at the hospital.  14. Do not apply any deodorants/lotions/powders.  15. Please wear clean clothes to the hospital.

## 2023-11-08 MED ORDER — CHLORHEXIDINE GLUCONATE CLOTH 2 % EX PADS: 6.0000 | MEDICATED_PAD | Freq: Once | CUTANEOUS | Status: DC

## 2023-11-08 MED ORDER — BUPIVACAINE LIPOSOME 1.3 % IJ SUSP: 20.0000 mL | Freq: Once | INTRAMUSCULAR | Status: DC

## 2023-11-08 MED ORDER — ORAL CARE MOUTH RINSE: 15.0000 mL | Freq: Once | OROMUCOSAL | Status: AC

## 2023-11-08 MED ORDER — ACETAMINOPHEN 500 MG PO TABS
1000.0000 mg | ORAL_TABLET | ORAL | Status: AC
  Administered 2023-11-09: 1000 mg via ORAL

## 2023-11-08 MED ORDER — FLEET ENEMA RE ENEM
1.0000 | ENEMA | Freq: Once | RECTAL | Status: AC
  Administered 2023-11-09: 1 via RECTAL

## 2023-11-08 MED ORDER — LACTATED RINGERS IV SOLN: INTRAVENOUS | Status: DC

## 2023-11-08 MED ORDER — GABAPENTIN 300 MG PO CAPS
300.0000 mg | ORAL_CAPSULE | ORAL | Status: AC
  Administered 2023-11-09: 300 mg via ORAL

## 2023-11-08 MED ORDER — CELECOXIB 200 MG PO CAPS
200.0000 mg | ORAL_CAPSULE | ORAL | Status: AC
  Administered 2023-11-09: 200 mg via ORAL

## 2023-11-08 MED ORDER — CHLORHEXIDINE GLUCONATE 0.12 % MT SOLN
15.0000 mL | Freq: Once | OROMUCOSAL | Status: AC
  Administered 2023-11-09: 15 mL via OROMUCOSAL

## 2023-11-08 MED ORDER — FLEET ENEMA RE ENEM
1.0000 | ENEMA | Freq: Once | RECTAL | Status: AC
  Administered 2023-11-08: 1 via RECTAL

## 2023-11-09 ENCOUNTER — Other Ambulatory Visit: Payer: Self-pay

## 2023-11-09 ENCOUNTER — Ambulatory Visit
Admission: RE | Admit: 2023-11-09 | Discharge: 2023-11-09 | Disposition: A | Discharge status: Home / Self Care | Payer: Commercial Managed Care - PPO | Attending: Surgery | Admitting: Surgery

## 2023-11-09 ENCOUNTER — Ambulatory Visit: Payer: Commercial Managed Care - PPO | Admitting: Registered Nurse

## 2023-11-09 ENCOUNTER — Encounter
Admission: RE | Disposition: A | Discharge status: Home / Self Care | Payer: Self-pay | Source: Home / Self Care | Attending: Surgery

## 2023-11-09 ENCOUNTER — Encounter: Payer: Self-pay | Admitting: Surgery

## 2023-11-09 DIAGNOSIS — K642 Third degree hemorrhoids: Secondary | ICD-10-CM | POA: Insufficient documentation

## 2023-11-09 DIAGNOSIS — K601 Chronic anal fissure: Secondary | ICD-10-CM | POA: Insufficient documentation

## 2023-11-09 DIAGNOSIS — K644 Residual hemorrhoidal skin tags: Secondary | ICD-10-CM | POA: Diagnosis not present

## 2023-11-09 DIAGNOSIS — K5900 Constipation, unspecified: Secondary | ICD-10-CM | POA: Insufficient documentation

## 2023-11-09 DIAGNOSIS — F17200 Nicotine dependence, unspecified, uncomplicated: Secondary | ICD-10-CM | POA: Insufficient documentation

## 2023-11-09 DIAGNOSIS — K641 Second degree hemorrhoids: Secondary | ICD-10-CM | POA: Insufficient documentation

## 2023-11-09 HISTORY — PX: HEMORRHOID SURGERY: SHX153

## 2023-11-09 SURGERY — HEMORRHOIDECTOMY
Anesthesia: General

## 2023-11-09 MED ORDER — FENTANYL CITRATE (PF) 100 MCG/2ML IJ SOLN
INTRAMUSCULAR | Status: AC
  Filled 2023-11-09: qty 2

## 2023-11-09 MED ORDER — DEXAMETHASONE SODIUM PHOSPHATE 10 MG/ML IJ SOLN
INTRAMUSCULAR | Status: AC
  Filled 2023-11-09: qty 1

## 2023-11-09 MED ORDER — BUPIVACAINE-EPINEPHRINE (PF) 0.25% -1:200000 IJ SOLN
INTRAMUSCULAR | Status: AC
  Filled 2023-11-09: qty 30

## 2023-11-09 MED ORDER — CELECOXIB 200 MG PO CAPS
ORAL_CAPSULE | ORAL | Status: AC
  Filled 2023-11-09: qty 1

## 2023-11-09 MED ORDER — DIBUCAINE (PERIANAL) 1 % EX OINT
TOPICAL_OINTMENT | CUTANEOUS | Status: AC
  Filled 2023-11-09: qty 28

## 2023-11-09 MED ORDER — FENTANYL CITRATE (PF) 100 MCG/2ML IJ SOLN: 25.0000 ug | INTRAMUSCULAR | Status: DC | PRN

## 2023-11-09 MED ORDER — OXYCODONE HCL 5 MG PO TABS: 5.0000 mg | ORAL_TABLET | Freq: Once | ORAL | Status: DC | PRN

## 2023-11-09 MED ORDER — LIDOCAINE HCL (PF) 2 % IJ SOLN
INTRAMUSCULAR | Status: AC
  Filled 2023-11-09: qty 5

## 2023-11-09 MED ORDER — GABAPENTIN 300 MG PO CAPS
ORAL_CAPSULE | ORAL | Status: AC
  Filled 2023-11-09: qty 1

## 2023-11-09 MED ORDER — ACETAMINOPHEN 500 MG PO TABS
ORAL_TABLET | ORAL | Status: AC
  Filled 2023-11-09: qty 2

## 2023-11-09 MED ORDER — PROPOFOL 10 MG/ML IV BOLUS
INTRAVENOUS | Status: AC
  Filled 2023-11-09: qty 20

## 2023-11-09 MED ORDER — GLYCOPYRROLATE 0.2 MG/ML IJ SOLN
INTRAMUSCULAR | Status: AC
  Filled 2023-11-09: qty 1

## 2023-11-09 MED ORDER — ALBUTEROL SULFATE HFA 108 (90 BASE) MCG/ACT IN AERS
INHALATION_SPRAY | RESPIRATORY_TRACT | Status: AC
  Filled 2023-11-09: qty 6.7

## 2023-11-09 MED ORDER — PROPOFOL 10 MG/ML IV BOLUS
INTRAVENOUS | Status: DC | PRN
  Administered 2023-11-09: 200 mg via INTRAVENOUS

## 2023-11-09 MED ORDER — SUCCINYLCHOLINE CHLORIDE 200 MG/10ML IV SOSY
PREFILLED_SYRINGE | INTRAVENOUS | Status: DC | PRN
  Administered 2023-11-09: 160 mg via INTRAVENOUS

## 2023-11-09 MED ORDER — ROCURONIUM BROMIDE 100 MG/10ML IV SOLN
INTRAVENOUS | Status: DC | PRN
  Administered 2023-11-09: 20 mg via INTRAVENOUS

## 2023-11-09 MED ORDER — ONDANSETRON HCL 4 MG/2ML IJ SOLN
INTRAMUSCULAR | Status: AC
  Filled 2023-11-09: qty 2

## 2023-11-09 MED ORDER — GELATIN ABSORBABLE 100 EX MISC
CUTANEOUS | Status: DC | PRN
  Administered 2023-11-09: 1

## 2023-11-09 MED ORDER — BUPIVACAINE LIPOSOME 1.3 % IJ SUSP
INTRAMUSCULAR | Status: AC
  Filled 2023-11-09: qty 20

## 2023-11-09 MED ORDER — FENTANYL CITRATE (PF) 100 MCG/2ML IJ SOLN
INTRAMUSCULAR | Status: DC | PRN
  Administered 2023-11-09: 50 ug via INTRAVENOUS
  Administered 2023-11-09: 100 ug via INTRAVENOUS
  Administered 2023-11-09: 50 ug via INTRAVENOUS

## 2023-11-09 MED ORDER — HYDROCODONE-ACETAMINOPHEN 5-325 MG PO TABS: 1.0000 | ORAL_TABLET | Freq: Four times a day (QID) | ORAL | 0 refills | Status: DC | PRN

## 2023-11-09 MED ORDER — GLYCOPYRROLATE 0.2 MG/ML IJ SOLN
INTRAMUSCULAR | Status: DC | PRN
  Administered 2023-11-09: .1 mg via INTRAVENOUS

## 2023-11-09 MED ORDER — MIDAZOLAM HCL 2 MG/2ML IJ SOLN
INTRAMUSCULAR | Status: AC
  Filled 2023-11-09: qty 2

## 2023-11-09 MED ORDER — DIBUCAINE 1 % EX OINT
TOPICAL_OINTMENT | CUTANEOUS | Status: DC | PRN
  Administered 2023-11-09: 1

## 2023-11-09 MED ORDER — LIDOCAINE HCL (CARDIAC) PF 100 MG/5ML IV SOSY
PREFILLED_SYRINGE | INTRAVENOUS | Status: DC | PRN
  Administered 2023-11-09: 80 mg via INTRAVENOUS

## 2023-11-09 MED ORDER — BUPIVACAINE-EPINEPHRINE 0.25% -1:200000 IJ SOLN
INTRAMUSCULAR | Status: DC | PRN
  Administered 2023-11-09: 50 mL

## 2023-11-09 MED ORDER — MIDAZOLAM HCL 2 MG/2ML IJ SOLN
INTRAMUSCULAR | Status: DC | PRN
  Administered 2023-11-09: 2 mg via INTRAVENOUS

## 2023-11-09 MED ORDER — DEXAMETHASONE SODIUM PHOSPHATE 10 MG/ML IJ SOLN
INTRAMUSCULAR | Status: DC | PRN
  Administered 2023-11-09: 10 mg via INTRAVENOUS

## 2023-11-09 MED ORDER — IBUPROFEN 800 MG PO TABS: 800.0000 mg | ORAL_TABLET | Freq: Three times a day (TID) | ORAL | 0 refills | Status: DC | PRN

## 2023-11-09 MED ORDER — SUGAMMADEX SODIUM 200 MG/2ML IV SOLN
INTRAVENOUS | Status: DC | PRN
  Administered 2023-11-09: 170 mg via INTRAVENOUS

## 2023-11-09 MED ORDER — OXYCODONE HCL 5 MG/5ML PO SOLN: 5.0000 mg | Freq: Once | ORAL | Status: DC | PRN

## 2023-11-09 MED ORDER — CHLORHEXIDINE GLUCONATE 0.12 % MT SOLN
OROMUCOSAL | Status: AC
  Filled 2023-11-09: qty 15

## 2023-11-09 MED ORDER — ALBUTEROL SULFATE HFA 108 (90 BASE) MCG/ACT IN AERS
INHALATION_SPRAY | RESPIRATORY_TRACT | Status: DC | PRN
  Administered 2023-11-09: 2 via RESPIRATORY_TRACT

## 2023-11-09 MED ORDER — ONDANSETRON HCL 4 MG/2ML IJ SOLN
INTRAMUSCULAR | Status: DC | PRN
  Administered 2023-11-09: 4 mg via INTRAVENOUS

## 2023-11-09 MED ORDER — GELATIN ABSORBABLE 100 CM EX MISC
CUTANEOUS | Status: AC
  Filled 2023-11-09: qty 1

## 2023-11-09 MED ORDER — ROCURONIUM BROMIDE 10 MG/ML (PF) SYRINGE
PREFILLED_SYRINGE | INTRAVENOUS | Status: AC
  Filled 2023-11-09: qty 10

## 2023-11-09 SURGICAL SUPPLY — 28 items
BLADE SURG 15 STRL LF DISP TIS (BLADE) ×1 IMPLANT
BRIEF MESH DISP 2XL (UNDERPADS AND DIAPERS) ×1 IMPLANT
DISSECTOR SURG LIGASURE 21 (MISCELLANEOUS) IMPLANT
DRAPE LAPAROTOMY 100X77 ABD (DRAPES) ×1 IMPLANT
DRSG GAUZE FLUFF 36X18 (GAUZE/BANDAGES/DRESSINGS) ×1 IMPLANT
ELECT CAUTERY BLADE TIP 2.5 (TIP) ×1
ELECT REM PT RETURN 9FT ADLT (ELECTROSURGICAL) ×1
ELECTRODE CAUTERY BLDE TIP 2.5 (TIP) ×1 IMPLANT
ELECTRODE REM PT RTRN 9FT ADLT (ELECTROSURGICAL) ×1 IMPLANT
GAUZE 4X4 16PLY ~~LOC~~+RFID DBL (SPONGE) ×1 IMPLANT
GLOVE ORTHO TXT STRL SZ7.5 (GLOVE) ×1 IMPLANT
GOWN STRL REUS W/ TWL LRG LVL3 (GOWN DISPOSABLE) ×1 IMPLANT
GOWN STRL REUS W/ TWL XL LVL3 (GOWN DISPOSABLE) ×1 IMPLANT
KIT TURNOVER KIT A (KITS) ×1 IMPLANT
MANIFOLD NEPTUNE II (INSTRUMENTS) ×1 IMPLANT
NDL HYPO 22X1.5 SAFETY MO (MISCELLANEOUS) ×1 IMPLANT
NEEDLE HYPO 22X1.5 SAFETY MO (MISCELLANEOUS) ×1
PACK BASIN MINOR ARMC (MISCELLANEOUS) ×1 IMPLANT
PAD ABD DERMACEA PRESS 5X9 (GAUZE/BANDAGES/DRESSINGS) IMPLANT
SOL PREP PVP 2OZ (MISCELLANEOUS) ×1
SOLUTION PREP PVP 2OZ (MISCELLANEOUS) ×1 IMPLANT
SURGILUBE 2OZ TUBE FLIPTOP (MISCELLANEOUS) ×1 IMPLANT
SUT CHROMIC 3 0 SH 27 (SUTURE) ×1 IMPLANT
SWABSTK COMLB BENZOIN TINCTURE (MISCELLANEOUS) ×1 IMPLANT
SYR 10ML LL (SYRINGE) ×1 IMPLANT
TAPE CLOTH 3X10 WHT NS LF (GAUZE/BANDAGES/DRESSINGS) ×2 IMPLANT
TRAP FLUID SMOKE EVACUATOR (MISCELLANEOUS) ×1 IMPLANT
WATER STERILE IRR 500ML POUR (IV SOLUTION) ×1 IMPLANT

## 2023-11-09 NOTE — Anesthesia Preprocedure Evaluation (Signed)
Anesthesia Evaluation  Patient identified by MRN, date of birth, ID band Patient awake    Reviewed: Allergy & Precautions, NPO status , Patient's Chart, lab work & pertinent test results  History of Anesthesia Complications Negative for: history of anesthetic complications  Airway Mallampati: II  TM Distance: >3 FB Neck ROM: full    Dental  (+) Chipped, Poor Dentition, Missing, Loose   Pulmonary neg pulmonary ROS, neg shortness of breath, Current Smoker and Patient abstained from smoking.   Pulmonary exam normal        Cardiovascular Exercise Tolerance: Good (-) angina (-) Past MI negative cardio ROS Normal cardiovascular exam     Neuro/Psych negative neurological ROS  negative psych ROS   GI/Hepatic negative GI ROS, Neg liver ROS,,,  Endo/Other  negative endocrine ROS    Renal/GU      Musculoskeletal   Abdominal   Peds  Hematology negative hematology ROS (+)   Anesthesia Other Findings Past Medical History: No date: Anal fissure No date: Anemia     Comment:  h/o with pregnancy No date: Bronchitis No date: Constipation No date: Marijuana use No date: Prolapsed internal hemorrhoids, grade 3 No date: Tobacco use  Past Surgical History: No date: ANKLE SURGERY 06/25/2020: LAPAROSCOPIC VAGINAL HYSTERECTOMY WITH SALPINGECTOMY;  Bilateral     Comment:  Procedure: LAPAROSCOPIC ASSISTED VAGINAL HYSTERECTOMY               WITH BILATERAL SALPINGECTOMY;  Surgeon: Suzy Bouchard, MD;  Location: [facility] ORS;  Service: Gynecology;               Laterality: Bilateral;  BMI    Body Mass Index: 32.17 kg/m      Reproductive/Obstetrics negative OB ROS                             Anesthesia Physical Anesthesia Plan  ASA: 3  Anesthesia Plan: General ETT   Post-op Pain Management:    Induction: Intravenous  PONV Risk Score and Plan: Ondansetron, Dexamethasone,  Midazolam and Treatment may vary due to age or medical condition  Airway Management Planned: Oral ETT  Additional Equipment:   Intra-op Plan:   Post-operative Plan: Extubation in OR  Informed Consent: I have reviewed the patients History and Physical, chart, labs and discussed the procedure including the risks, benefits and alternatives for the proposed anesthesia with the patient or authorized representative who has indicated his/her understanding and acceptance.     Dental Advisory Given  Plan Discussed with: Anesthesiologist, CRNA and Surgeon  Anesthesia Plan Comments: (Patient consented for risks of anesthesia including but not limited to:  - adverse reactions to medications - damage to eyes, teeth, lips or other oral mucosa - nerve damage due to positioning  - sore throat or hoarseness - Damage to heart, brain, nerves, lungs, other parts of body or loss of life  Patient voiced understanding and assent.)       Anesthesia Quick Evaluation

## 2023-11-09 NOTE — Interval H&P Note (Signed)
History and Physical Interval Note:  11/09/2023 12:45 PM  Teresa Mclean  has presented today for surgery, with the diagnosis of prolapsing hemorrhoids internal grade 3.  The various methods of treatment have been discussed with the patient and family. After consideration of risks, benefits and other options for treatment, the patient has consented to  Procedure(s): HEMORRHOIDECTOMY, 2 plus columns internal and external (N/A) as a surgical intervention.  The patient's history has been reviewed, patient examined, no change in status, stable for surgery.  I have reviewed the patient's chart and labs.  Questions were answered to the patient's satisfaction.     Campbell Lerner

## 2023-11-09 NOTE — Transfer of Care (Signed)
Immediate Anesthesia Transfer of Care Note  Patient: Teresa Mclean  Procedure(s) Performed: HEMORRHOIDECTOMY, 2 plus columns internal and external  Patient Location: PACU  Anesthesia Type:General  Level of Consciousness: drowsy  Airway & Oxygen Therapy: Patient Spontanous Breathing and Patient connected to face mask oxygen  Post-op Assessment: Report given to RN and Post -op Vital signs reviewed and stable  Post vital signs: stable  Last Vitals:  Vitals Value Taken Time  BP 146/103 11/09/23 1440  Temp    Pulse 118 11/09/23 1442  Resp 23 11/09/23 1442  SpO2 100 % 11/09/23 1442  Vitals shown include unfiled device data.  Last Pain:  Vitals:   11/09/23 1248  TempSrc: Oral  PainSc: 0-No pain         Complications: No notable events documented.

## 2023-11-09 NOTE — Op Note (Signed)
SURGICAL OPERATIVE REPORT   DATE OF PROCEDURE: 11/09/2023  ATTENDING Surgeon(s): Campbell Lerner, MD  ANESTHESIA: GETA  PRE-OPERATIVE DIAGNOSIS: Symptomatic/Bleeding 2nd and 3rd degree internal and external hemorrhoids with symptoms not well-controlled despite medical management   POST-OPERATIVE DIAGNOSIS: Same  PROCEDURE(S):  1.) Anoscopy 2.) Extensive hemorrhoidectomy, 2 or more columns:  Left anterior internal, Left posterior internal/external, and Right anterior internal/external hemorrhoids.  INTRAOPERATIVE FINDINGS: 2nd and  3rd degree internal hemorrhoids and minimalexternal hemorrhoids   ESTIMATED BLOOD LOSS: 25 mL  URINE OUTPUT: No foley   SPECIMENS: Left anterior internal, Left posterior internal/external, and Right anterior internal/external hemorrhoids.  COMPLICATIONS: None apparent   CONDITION AT END OF PROCEDURE: Hemodynamically stable and extubated   DISPOSITION OF PATIENT: PACU  INDICATIONS FOR PROCEDURE:  Patient is a 43 y.o. female who presented with symptomatic/bleeding 2nd/3rd degree internal and external hemorrhoids not well-controlled with medical management.  All risks, benefits, and alternatives to above procedure were discussed with the patient, all of patient's questions were answered to her expressed satisfaction, and informed consent was obtained and documented.   DETAILS OF PROCEDURE: Patient was brought to the operative suite and appropriately identified. General anesthesia was administered along with appropriate pre-operative antibiotics, and endotracheal intubation was performed by anesthetist.  Repositioned to prone/jackknife and buttocks taped for exposure, operative site was prepped and draped in the usual sterile fashion, and following a brief time out, rigid anoscopy was performed with findings of no distal rectal masses, normal, but redundant mucosa,  prominent/prolapsing areas involving posterior right and anterior and posterior left  quadrants. Three hemorrhoidal pedicles were identified. An Alice clamp was placed near the base of each pedicle near the dentate line and retracted externally to exteriorize the hemorrhoidal pedicle. Retracted hemorrhoid was carefully dissected from underlying/adherent tissues, and Ligasure Exact was advanced across the base of the hemorrhoidal pedicle, and the above was repeated for all 3 internal/external hemorrhoids. The underlying internal sphincteric muscle fibers were preserved.  Hemostasis was achieved/confirmed, a hemostatic locking 3-0 chromic suture is applied in running manner from proximally to distally, leaving the distal aspect open, on two of the largest columns, and the most anterior was left open. Hemostasis is assured, and local anesthetic of 50mL total Exparel mixed with quarter percent Marcaine with epinephrine was injected.  Surgifoam is saturated/rolled with Nupercainal ointment was inserted into the anal canal for additional hemostasis and pain control. Fluffs, ABD, and mesh briefs were applied.  Patient was then safely able to be extubated, awakened, and transferred to PACU for post-operative monitoring and care.  Campbell Lerner, M.D., [facility]  11/09/2023 ; 2:35 PM

## 2023-11-11 ENCOUNTER — Encounter: Payer: Self-pay | Admitting: Surgery

## 2023-11-12 LAB — SURGICAL PATHOLOGY

## 2023-11-12 NOTE — Anesthesia Postprocedure Evaluation (Signed)
Anesthesia Post Note  Patient: Teresa Mclean  Procedure(s) Performed: HEMORRHOIDECTOMY, 2 plus columns internal and external  Patient location during evaluation: PACU Anesthesia Type: General Level of consciousness: awake and alert Pain management: pain level controlled Vital Signs Assessment: post-procedure vital signs reviewed and stable Respiratory status: spontaneous breathing, nonlabored ventilation, respiratory function stable and patient connected to nasal cannula oxygen Cardiovascular status: blood pressure returned to baseline and stable Postop Assessment: no apparent nausea or vomiting Anesthetic complications: no   No notable events documented.   Last Vitals:  Vitals:   11/09/23 1516 11/09/23 1533  BP: (!) 143/87 (!) 141/94  Pulse: 66 74  Resp: 14 16  Temp: (!) 36.2 C (!) 36.1 C  SpO2: 100% 100%    Last Pain:  Vitals:   11/09/23 1533  TempSrc: Temporal  PainSc: 0-No pain                 Cleda Mccreedy Hosam Mcfetridge

## 2023-11-22 ENCOUNTER — Encounter: Payer: Self-pay | Admitting: Surgery

## 2023-11-22 ENCOUNTER — Ambulatory Visit (INDEPENDENT_AMBULATORY_CARE_PROVIDER_SITE_OTHER): Payer: Commercial Managed Care - PPO | Admitting: Surgery

## 2023-11-22 VITALS — BP 123/78 | HR 75 | Temp 97.9°F | Ht 64.0 in | Wt 184.8 lb

## 2023-11-22 DIAGNOSIS — Z8719 Personal history of other diseases of the digestive system: Secondary | ICD-10-CM | POA: Insufficient documentation

## 2023-11-22 DIAGNOSIS — K642 Third degree hemorrhoids: Secondary | ICD-10-CM

## 2023-11-22 DIAGNOSIS — Z9889 Other specified postprocedural states: Secondary | ICD-10-CM

## 2023-11-22 NOTE — Patient Instructions (Signed)
See follow up appointment.   Surgical Procedures for Hemorrhoids, Care After After surgery for hemorrhoids, it is common to have: Pain in your rectum for 2-4 weeks after the procedure, and may take 1-2 months to fully recover. Pain when you are pooping. A small amount of bleeding from your rectum. This is more likely to happen the first time you poop after surgery. Follow these instructions at home: Medicines Take over-the-counter and prescription medicines only as told by your health care provider. If you were prescribed antibiotics, use them as told by your provider. Do not stop using the antibiotic even if you start to feel better. Ask your provider if the medicine prescribed to you requires you to avoid driving or using machinery. Use a stool softener or a medicine that helps you poop (laxative) as told by your provider. Eating and drinking Follow instructions from your provider about what you may eat and drink after the surgery. You may need to take these actions to prevent or treat constipation: Drink enough fluid to keep your pee (urine) pale yellow. Take over-the-counter or prescription medicines. Eat foods that are high in fiber, such as beans, whole grains, and fresh fruits and vegetables. Limit foods that are high in fat and processed sugars, such as fried or sweet foods. Managing pain and swelling  Take warm sitz baths for 15-20 minutes, 2-3 times a day to ease soreness and itching. This will also help keep the rectal area clean. If told, put ice on the affected area. It may help to use ice packs between sitz baths. Put ice in a plastic bag. Place a towel between your skin and the bag. Leave the ice on for 20 minutes, 2-3 times a day. If your skin turns bright red, remove the ice right away to prevent skin damage. The risk of damage is higher if you cannot feel pain, heat, or cold. Activity Rest as told by your provider. Do not sit for a long time without moving. Get up to  take short walks every 1-2 hours. This will improve blood flow and breathing. Ask for help if you feel weak or unsteady. You may have to avoid lifting. Ask your provider how much you can safely lift. Return to your normal activities as told by your provider. Ask your provider what activities are safe for you. General instructions Do not strain to poop. Do not spend a long time sitting on the toilet. If you were given a sedative during the surgery, it can affect you for several hours. Do not drive or operate machinery until your provider says that it is safe. Your provider may give you more instructions. Make sure you know what you can and cannot do. Contact a health care provider if: Your pain medicine is not helping. You have a fever or chills. You have drainage that smells bad. You have a lot of swelling. You become constipated. You have trouble peeing (urinating). You have very bad pain in your rectum. Get help right away if: You are bleeding a lot from your rectum. This information is not intended to replace advice given to you by your health care provider. Make sure you discuss any questions you have with your health care provider. Document Revised: 05/19/2022 Document Reviewed: 05/19/2022 Elsevier Patient Education  2024 [employer].

## 2023-11-22 NOTE — Progress Notes (Signed)
[facility] POST-OP OFFICE VISIT  11/22/2023  HPI: Teresa Mclean is a 43 y.o. female had surgery on  11/09/2023 , now s/p hemorrhoidectomy.  Reports bowel movements are minimum of daily, taking fiber supplementation fiber rich foods stool softeners and MiraLAX.  She reports she still has some soreness with bowel activity.  But nothing like before.  Denies any significant bleeding.  Denies nausea, vomiting, fevers or chills.  Vital signs: BP 123/78   Pulse 75   Temp 97.9 F (36.6 C) (Oral)   Ht 5\' 4"  (1.626 m)   Wt 184 lb 12.8 oz (83.8 kg)   LMP 05/28/2020 (Exact Date)   SpO2 98%   BMI 31.72 kg/m    Physical Exam: Constitutional: She appears well, bright and pleasant. Abdomen: Benign DRE: Deferred, postop.  Assessment/Plan: This is a 43 y.o. female who had surgery on  11/09/2023 , now s/p hemorrhoidectomy   Patient Active Problem List   Diagnosis Date Noted   Prolapsed internal hemorrhoids, grade 3 02/23/2023   Constipation 02/23/2023   Anal fissure 02/23/2023    -Continue bowel regimen, local pain control.  Follow-up in 2 weeks.  She does not feel she can resume work yet due to post defecation pain persistence.  Hopefully can release her back to work on her next visit.   Campbell Lerner M.D., FACS 11/22/2023, 9:51 AM

## 2023-12-06 ENCOUNTER — Encounter: Payer: Self-pay | Admitting: Surgery

## 2023-12-06 ENCOUNTER — Ambulatory Visit: Payer: Commercial Managed Care - PPO | Admitting: Surgery

## 2023-12-06 VITALS — BP 118/78 | HR 80 | Temp 98.8°F | Ht 64.0 in | Wt 187.0 lb

## 2023-12-06 DIAGNOSIS — Z8719 Personal history of other diseases of the digestive system: Secondary | ICD-10-CM

## 2023-12-06 DIAGNOSIS — K642 Third degree hemorrhoids: Secondary | ICD-10-CM

## 2023-12-06 DIAGNOSIS — Z09 Encounter for follow-up examination after completed treatment for conditions other than malignant neoplasm: Secondary | ICD-10-CM

## 2023-12-06 NOTE — Patient Instructions (Signed)
 Surgical Procedures for Hemorrhoids, Care After After surgery for hemorrhoids, it is common to have: Pain in your rectum for 2-4 weeks after the procedure, and may take 1-2 months to fully recover. Pain when you are pooping. A small amount of bleeding from your rectum. This is more likely to happen the first time you poop after surgery. Follow these instructions at home: Medicines Take over-the-counter and prescription medicines only as told by your health care provider. If you were prescribed antibiotics, use them as told by your provider. Do not stop using the antibiotic even if you start to feel better. Ask your provider if the medicine prescribed to you requires you to avoid driving or using machinery. Use a stool softener or a medicine that helps you poop (laxative) as told by your provider. Eating and drinking Follow instructions from your provider about what you may eat and drink after the surgery. You may need to take these actions to prevent or treat constipation: Drink enough fluid to keep your pee (urine) pale yellow. Take over-the-counter or prescription medicines. Eat foods that are high in fiber, such as beans, whole grains, and fresh fruits and vegetables. Limit foods that are high in fat and processed sugars, such as fried or sweet foods. Managing pain and swelling  Take warm sitz baths for 15-20 minutes, 2-3 times a day to ease soreness and itching. This will also help keep the rectal area clean. If told, put ice on the affected area. It may help to use ice packs between sitz baths. Put ice in a plastic bag. Place a towel between your skin and the bag. Leave the ice on for 20 minutes, 2-3 times a day. If your skin turns bright red, remove the ice right away to prevent skin damage. The risk of damage is higher if you cannot feel pain, heat, or cold. Activity Rest as told by your provider. Do not sit for a long time without moving. Get up to take short walks every 1-2  hours. This will improve blood flow and breathing. Ask for help if you feel weak or unsteady. You may have to avoid lifting. Ask your provider how much you can safely lift. Return to your normal activities as told by your provider. Ask your provider what activities are safe for you. General instructions Do not strain to poop. Do not spend a long time sitting on the toilet. If you were given a sedative during the surgery, it can affect you for several hours. Do not drive or operate machinery until your provider says that it is safe. Your provider may give you more instructions. Make sure you know what you can and cannot do. Contact a health care provider if: Your pain medicine is not helping. You have a fever or chills. You have drainage that smells bad. You have a lot of swelling. You become constipated. You have trouble peeing (urinating). You have very bad pain in your rectum. Get help right away if: You are bleeding a lot from your rectum. This information is not intended to replace advice given to you by your health care provider. Make sure you discuss any questions you have with your health care provider. Document Revised: 05/19/2022 Document Reviewed: 05/19/2022 Elsevier Patient Education  2024 [employer].

## 2023-12-06 NOTE — Progress Notes (Unsigned)
 [facility] POST-OP OFFICE VISIT  12/06/2023  HPI: Teresa Mclean is a 43 y.o. female had surgery on  11/09/2023 , now s/p hemorrhoidectomy.  Reports bowel movements are minimum of daily, taking fiber supplementation fiber rich foods stool softeners and MiraLAX.  She reports she has no pain, no issues. Denies any bleeding.  Denies nausea, vomiting, fevers or chills.  Vital signs: BP 118/78   Pulse 80   Temp 98.8 F (37.1 C) (Oral)   Ht 5\' 4"  (1.626 m)   Wt 187 lb (84.8 kg)   LMP 05/28/2020 (Exact Date)   SpO2 97%   BMI 32.10 kg/m    Physical Exam: Constitutional: She appears well, bright and pleasant. Abdomen: Benign DRE: [name] present.  No external evidence of nonhealed areas, no evidence of induration or tenderness on distal DRE.  Assessment/Plan: This is a 43 y.o. female who had surgery on  11/09/2023 , now s/p hemorrhoidectomy   Patient Active Problem List   Diagnosis Date Noted   Status post hemorrhoidectomy 11/22/2023   Prolapsed internal hemorrhoids, grade 3 02/23/2023   Constipation 02/23/2023   Anal fissure 02/23/2023    -Continue bowel regimen, follow-up as needed.  She  can resume work now.    Campbell Lerner M.D., FACS 12/06/2023, 11:55 AM

## 2023-12-18 ENCOUNTER — Telehealth: Payer: Self-pay | Admitting: *Deleted

## 2023-12-18 NOTE — Telephone Encounter (Signed)
 Patient would like to come pick up a return to work note, she returned on 12/11/23 and it needs to state no restrictions.  Patient had surgery on 11/09/23 Hemorrhoidectomy Dr Claudine Mouton   Please call when ready

## 2023-12-18 NOTE — Telephone Encounter (Signed)
 Message left for the patient that her work note is ready for pick up at the front desk.

## 2024-05-19 DIAGNOSIS — R1011 Right upper quadrant pain: Secondary | ICD-10-CM | POA: Insufficient documentation

## 2024-07-25 ENCOUNTER — Other Ambulatory Visit: Payer: Self-pay | Admitting: Nurse Practitioner

## 2024-07-25 DIAGNOSIS — Z1231 Encounter for screening mammogram for malignant neoplasm of breast: Secondary | ICD-10-CM

## 2024-09-09 ENCOUNTER — Ambulatory Visit

## 2024-09-29 ENCOUNTER — Other Ambulatory Visit: Payer: Self-pay

## 2024-10-05 NOTE — Progress Notes (Unsigned)
 "   10/06/2024 Teresa Mclean [id number] 1981-03-19  Gastroenterology Office Note    Referring Provider: Debarah Catheryn PARAS, FNP Primary Care Physician:  Debarah Catheryn PARAS, FNP  Primary GI Provider: Jinny Carmine, MD    Chief Complaint   Chief Complaint  Patient presents with   New Patient (Initial Visit)    RUQ pain x 1 yr- she was prescribed pepcid  30 day trial and it helped a lot- the RUQ pain continues especially when bending over-dark stools x 3 months--has constipation and takes  OTC laxative-has taken miralax in the past but does not take it on regular basis.      History of Present Illness   Teresa Mclean is a 43 y.o. female with PMHX of hemorrhoidectomy presenting today at the request of Debarah, Robyn J, FNP due to right upper quadrant Abd pain.   Discussed the use of AI scribe software for clinical note transcription with the patient, who gave verbal consent to proceed.  Patient states intermittent right upper quadrant abdominal pain has been present for over a year. She was seen at [facility] emergency room in Aug 2025, and CT scan was performed. Pain occurs approximately three days per week, lasts about one hour per episode, and reaches a severity of 7 out of 10. Pain is localized to the right upper quadrant and sometimes the mid-abdomen, is alleviated by frequent belching, and is associated with discomfort during movements such as bending over. No lower abdominal pain reported.  For the past year, acid reflux symptoms include burning chest pain, a sensation of something just sitting in the chest, and frequent regurgitation of food, particularly after coffee and smoking. Infrequent vomiting occurs without associated nausea. No nocturnal heartburn. Famotidine  initiated in August or September 2025 provided significant relief, but she is not currently taking it. Minimal use of over-the-counter antacids. Daily intake includes two to four sodas and two to three cups of coffee. She smokes  half a pack of cigarettes daily and drinks alcohol socially, about five glasses per month. She endorses eating a lot of fried foods.   Constipation has been present for approximately two years, predating hemorrhoid surgery in September 2024. Bowel movements occur daily but are often small in volume and require straining. Occasional use of stimulant laxatives and inconsistent use of Miralax. Frequent bloating described as constant and severe. Water intake is limited to two to three cups per day.  Black stools have been noted for the past three weeks, described as black but not tarry or sticky. No bright red blood in stools. No use of NSAIDs, ibuprofen , Aleve, or Goody powders.       Patient seen at [facility] emergency department on 05/12/2024 with right upper quadrant abdominal pain. CT Abd pelvis: No nephrolithiasis or hydronephrosis.  -- There is a 3.4 cm right hypoattenuating ovarian focus consistent with ovarian cyst. Recommend attention on future imaging to ensure stability.     Past Medical History:  Diagnosis Date   Abdominal pain, LLQ 10/23/2019   Anal fissure    Anemia    h/o with pregnancy   Bronchitis    Cervical dysplasia 10/04/2016   Dysplasia Hx:  1. 08/28/16 HSIL   2. 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, negative margins     Constipation    COVID-19 05/16/2023   Discoid lupus erythematosus 09/21/2022   Encounter for pre-employment examination 07/05/2020   Family history of diseases of the blood and blood-forming organs and certain disorders involving the immune mechanism 07/21/2022  Family history of diseases of the blood and blood-forming organs and certain disorders involving the immune mechanism 07/21/2022   Foot pain, left 06/27/2016   Generalized abdominal pain 02/13/2023   High risk sexual behavior 06/27/2016   Marijuana use    Nicotine dependence, cigarettes, with withdrawal 06/27/2016   Obesity, unspecified 07/24/2023   Otitis media with effusion, left 09/21/2022    Pain in joint involving shoulder region 07/07/2021   Pain in left knee 07/07/2021   Prediabetes 06/27/2016   Prolapsed internal hemorrhoids, grade 3    Right upper quadrant pain 05/19/2024   Tobacco use    Tobacco use disorder 06/27/2016   Unspecified abnormal findings in urine 08/28/2016   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/03/2016    Past Surgical History:  Procedure Laterality Date   ANKLE SURGERY     HEMORRHOID SURGERY N/A 11/09/2023   Procedure: HEMORRHOIDECTOMY, 2 plus columns internal and external;  Surgeon: Lane Shope, MD;  Location: [facility] ORS;  Service: General;  Laterality: N/A;   LAPAROSCOPIC VAGINAL HYSTERECTOMY WITH SALPINGECTOMY Bilateral 06/25/2020   Procedure: LAPAROSCOPIC ASSISTED VAGINAL HYSTERECTOMY WITH BILATERAL SALPINGECTOMY;  Surgeon: Lovetta Debby PARAS, MD;  Location: [facility] ORS;  Service: Gynecology;  Laterality: Bilateral;    Current Outpatient Medications  Medication Sig Dispense Refill   pantoprazole (PROTONIX) 40 MG tablet Take 1 tablet (40 mg total) by mouth daily. 90 tablet 0   No current facility-administered medications for this visit.    Allergies as of 10/06/2024   (No Known Allergies)    Family History  Problem Relation Age of Onset   Diabetes Mother     Social History   Socioeconomic History   Marital status: Single    Spouse name: Not on file   Number of children: Not on file   Years of education: Not on file   Highest education level: Not on file  Occupational History   Not on file  Tobacco Use   Smoking status: Every Day    Types: Cigarettes    Passive exposure: Past   Smokeless tobacco: Current  Vaping Use   Vaping status: Never Used  Substance and Sexual Activity   Alcohol use: Yes    Comment: occ   Drug use: Yes   Sexual activity: Not on file  Other Topics Concern   Not on file  Social History Narrative   Not on file   Social Drivers of Health   Tobacco Use: High Risk (10/06/2024)   Patient History     Smoking Tobacco Use: Every Day    Smokeless Tobacco Use: Current    Passive Exposure: Past  Financial Resource Strain: Not on file  Food Insecurity: Not on file  Transportation Needs: Not on file  Physical Activity: Not on file  Stress: Not on file  Social Connections: Not on file  Intimate Partner Violence: Not on file  Depression (EYV7-0): Not on file  Alcohol Screen: Not on file  Housing: Not on file  Utilities: Not on file  Health Literacy: Not on file     RELEVANT GI HISTORY, IMAGING AND LABS: CBC    Component Value Date/Time   WBC 9.5 10/29/2023 1037   RBC 4.09 10/29/2023 1037   HGB 13.3 10/29/2023 1037   HCT 39.9 10/29/2023 1037   PLT 201 10/29/2023 1037   MCV 97.6 10/29/2023 1037   MCH 32.5 10/29/2023 1037   MCHC 33.3 10/29/2023 1037   RDW 12.7 10/29/2023 1037   LYMPHSABS 2.9 10/29/2023 1037   MONOABS 0.8 10/29/2023  1037   EOSABS 0.0 10/29/2023 1037   BASOSABS 0.0 10/29/2023 1037   Recent Labs    10/29/23 1037  HGB 13.3    CMP     Component Value Date/Time   NA 137 10/29/2023 1037   K 3.8 10/29/2023 1037   CL 105 10/29/2023 1037   CO2 25 10/29/2023 1037   GLUCOSE 84 10/29/2023 1037   BUN 13 10/29/2023 1037   CREATININE 0.83 10/29/2023 1037   CALCIUM 9.1 10/29/2023 1037   PROT 7.1 10/29/2023 1037   ALBUMIN 3.8 10/29/2023 1037   AST 19 10/29/2023 1037   ALT 16 10/29/2023 1037   ALKPHOS 41 10/29/2023 1037   BILITOT 0.6 10/29/2023 1037   GFRNONAA >60 10/29/2023 1037   GFRAA >60 06/23/2020 1129      Latest Ref Rng & Units 10/29/2023   10:37 AM 03/08/2020    1:31 PM 05/19/2016    6:43 PM  Hepatic Function  Total Protein 6.5 - 8.1 g/dL 7.1  7.8  7.1   Albumin 3.5 - 5.0 g/dL 3.8  4.2  3.9   AST 15 - 41 U/L 19  18  19    ALT 0 - 44 U/L 16  19  18    Alk Phosphatase 38 - 126 U/L 41  51  48   Total Bilirubin 0.0 - 1.2 mg/dL 0.6  0.6  0.3       Review of Systems   All systems reviewed and negative except where noted in HPI.    Physical Exam   BP 120/79   Pulse (!) 101   Temp 98.7 F (37.1 C)   Ht 5' 4 (1.626 m)   Wt 192 lb 6.4 oz (87.3 kg)   LMP 05/28/2020   SpO2 95%   BMI 33.03 kg/m  Patient's last menstrual period was 05/28/2020. General:   Alert and oriented. Pleasant and cooperative. Well-nourished and well-developed. NAD. Head:  Normocephalic and atraumatic. Eyes:  Without icterus Ears:  Normal auditory acuity. Lungs:  Respirations even and unlabored.  Clear throughout to auscultation.   No wheezes, crackles, or rhonchi. No acute distress. Heart:  Regular rate and rhythm; no murmurs, clicks, rubs, or gallops. Abdomen:  Normal bowel sounds.  No bruits.  Mild TTP epigastric area, soft, non-distended without masses, hepatosplenomegaly or hernias noted.  No guarding or rebound tenderness.    Rectal:  Deferred. Msk:  Symmetrical without gross deformities. Normal posture. Extremities:  Without edema. Neurologic:  Alert and  oriented x4;  grossly normal neurologically. Skin:  Intact without significant lesions or rashes. Psych:  Alert and cooperative. Normal mood and affect.   Assessment & Plan   Teresa Mclean is a 43 y.o. female presenting today with RUQ and epigastric pain, bloating, GERD, and constipation.   RUQ abdominal pain, epigastric pain, and bloating - will order RUQ US  - check H. Pylori breath test - proceed with EGD in the near future to assess for ulcers, inflammation. I discussed risks of EGD with patient today, including risk of sedation, bleeding or perforation. Patient provides understanding and gave verbal consent to proceed.  GERD - Lifestyle changes discussed, avoid fried food, sodas, NSAIDS, ETOH. Do not eat meals 3 hr prior to bed. - Smoking cessation discussed in detail. - start pantoprazole 40 mg once daily 30 min prior to meals. Wait until after H. Pylori breath test to start. - Proceed with EGD.  Constipation. Also having dark stools - check TSH - proceed with EGD to assess for  possible  UGI bleed - Recommend High Fiber diet with fruits, vegetables, and whole grains. - Drink 64 ounces of Fluids Daily. - Start Miralax Mix 1/2-1 capful in a drink daily. - If no improvement, consider Linzess or Trulance.  I discussed the assessment and treatment plan with the patient. The patient was provided an opportunity to ask questions and all were answered. The patient agreed with the plan and demonstrated an understanding of the instructions.   Follow up in 2 months  Grayce Bohr, DNP, AGNP-C [facility]  "

## 2024-10-06 ENCOUNTER — Encounter: Payer: Self-pay | Admitting: Family Medicine

## 2024-10-06 ENCOUNTER — Ambulatory Visit: Admitting: Family Medicine

## 2024-10-06 VITALS — BP 120/79 | HR 101 | Temp 98.7°F | Ht 64.0 in | Wt 192.4 lb

## 2024-10-06 DIAGNOSIS — R195 Other fecal abnormalities: Secondary | ICD-10-CM

## 2024-10-06 DIAGNOSIS — K219 Gastro-esophageal reflux disease without esophagitis: Secondary | ICD-10-CM

## 2024-10-06 DIAGNOSIS — K5909 Other constipation: Secondary | ICD-10-CM

## 2024-10-06 DIAGNOSIS — R1013 Epigastric pain: Secondary | ICD-10-CM

## 2024-10-06 DIAGNOSIS — K5900 Constipation, unspecified: Secondary | ICD-10-CM

## 2024-10-06 DIAGNOSIS — R1011 Right upper quadrant pain: Secondary | ICD-10-CM | POA: Diagnosis not present

## 2024-10-06 DIAGNOSIS — R140 Abdominal distension (gaseous): Secondary | ICD-10-CM | POA: Diagnosis not present

## 2024-10-06 DIAGNOSIS — G8929 Other chronic pain: Secondary | ICD-10-CM

## 2024-10-06 MED ORDER — PANTOPRAZOLE SODIUM 40 MG PO TBEC: 40.0000 mg | DELAYED_RELEASE_TABLET | Freq: Every day | ORAL | 0 refills | Status: AC

## 2024-10-06 NOTE — Patient Instructions (Addendum)
 Ultrasound scheduled 10/08/24 arrival at 8:30 @   Outpatient [facility].   [address] [place]  Nothing to eat/drink after midnight.     Drink 64 ounces of water / fluids Daily.   For constipation: Start OTC Miralax Powder Mix 1/2-1 capful in 6 to 8 ounces of a drink once daily or every other day.  Recommend high-fiber diet, 30 g of fiber daily Eat fruits, vegetables, and whole grains Drink 64 ounces of water / fluids daily.

## 2024-10-08 ENCOUNTER — Ambulatory Visit
Admission: RE | Admit: 2024-10-08 | Discharge: 2024-10-08 | Disposition: A | Discharge status: Home / Self Care | Source: Ambulatory Visit | Attending: Family Medicine | Admitting: Family Medicine

## 2024-10-08 DIAGNOSIS — R1011 Right upper quadrant pain: Secondary | ICD-10-CM | POA: Diagnosis present

## 2024-10-14 ENCOUNTER — Ambulatory Visit: Payer: Self-pay | Admitting: Family Medicine

## 2024-10-14 NOTE — Telephone Encounter (Signed)
 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  No signs of cholecystitis or biliary obstruction. Slight increased hepatic echogenicity, nonspecific but can be seen with hepatic steatosis.   Dorothe, I tried to call patient myself and left a message about ultrasound results.  If she calls back, please let her know there is nothing concerning with her results.  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  There are tiny stones, no signs of gallbladder inflammation or obstruction.  Possibly fatty liver.    Thanks, RE   Grayce Bohr, DNP, AGNP-C

## 2024-10-21 NOTE — Telephone Encounter (Signed)
 LVM to call office-    Patient notified. No questions.  RUQ ultrasound fundus demonstrates a Phrygian cap and possibly contains tiny subcentimeter nonshadowing stones or polyps measuring up to 2 mm.  No signs of cholecystitis or biliary obstruction. Slight increased hepatic echogenicity, nonspecific but can be seen with hepatic steatosis.   Teresa Mclean, I tried to call patient myself and left a message about ultrasound results.  If she calls back, please let her know there is nothing concerning with her results.  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  There are tiny stones, no signs of gallbladder inflammation or obstruction.  Possibly fatty liver.    Thanks, RE   Grayce Bohr, DNP, AGNP-C

## 2024-11-21 ENCOUNTER — Encounter: Admission: RE | Payer: Self-pay | Source: Home / Self Care

## 2024-11-21 ENCOUNTER — Ambulatory Visit: Admission: RE | Admit: 2024-11-21 | Source: Home / Self Care | Admitting: Gastroenterology

## 2024-12-04 ENCOUNTER — Ambulatory Visit: Admitting: Family Medicine
